# Patient Record
Sex: Female | Born: 1980 | Race: Black or African American | Hispanic: No | Marital: Married | Smoking: Never smoker
Health system: Southern US, Community
[De-identification: ages and names within clinical notes are randomized; demographics above are authoritative.]

## PROBLEM LIST (undated history)

## (undated) ENCOUNTER — Inpatient Hospital Stay (HOSPITAL_COMMUNITY): Payer: Self-pay

## (undated) DIAGNOSIS — O00109 Unspecified tubal pregnancy without intrauterine pregnancy: Secondary | ICD-10-CM

## (undated) DIAGNOSIS — E559 Vitamin D deficiency, unspecified: Secondary | ICD-10-CM

## (undated) DIAGNOSIS — D649 Anemia, unspecified: Secondary | ICD-10-CM

## (undated) DIAGNOSIS — N83209 Unspecified ovarian cyst, unspecified side: Secondary | ICD-10-CM

## (undated) DIAGNOSIS — Z8619 Personal history of other infectious and parasitic diseases: Secondary | ICD-10-CM

## (undated) DIAGNOSIS — N390 Urinary tract infection, site not specified: Secondary | ICD-10-CM

## (undated) DIAGNOSIS — I10 Essential (primary) hypertension: Secondary | ICD-10-CM

## (undated) HISTORY — PX: INDUCED ABORTION: SHX677

## (undated) HISTORY — DX: Anemia, unspecified: D64.9

## (undated) HISTORY — DX: Vitamin D deficiency, unspecified: E55.9

## (undated) HISTORY — PX: DILATION AND CURETTAGE OF UTERUS: SHX78

## (undated) HISTORY — DX: Personal history of other infectious and parasitic diseases: Z86.19

## (undated) HISTORY — DX: Essential (primary) hypertension: I10

---

## 2001-04-13 ENCOUNTER — Encounter: Payer: Self-pay | Admitting: Emergency Medicine

## 2001-04-13 ENCOUNTER — Emergency Department (HOSPITAL_COMMUNITY): Admission: EM | Admit: 2001-04-13 | Discharge: 2001-04-13 | Payer: Self-pay | Admitting: Emergency Medicine

## 2001-10-23 ENCOUNTER — Emergency Department (HOSPITAL_COMMUNITY): Admission: EM | Admit: 2001-10-23 | Discharge: 2001-10-23 | Payer: Self-pay

## 2002-09-25 ENCOUNTER — Emergency Department (HOSPITAL_COMMUNITY): Admission: EM | Admit: 2002-09-25 | Discharge: 2002-09-25 | Payer: Self-pay | Admitting: Emergency Medicine

## 2002-12-23 ENCOUNTER — Emergency Department (HOSPITAL_COMMUNITY): Admission: EM | Admit: 2002-12-23 | Discharge: 2002-12-24 | Payer: Self-pay | Admitting: Emergency Medicine

## 2003-12-31 ENCOUNTER — Ambulatory Visit: Payer: Self-pay | Admitting: Family Medicine

## 2004-02-03 ENCOUNTER — Ambulatory Visit: Payer: Self-pay | Admitting: Internal Medicine

## 2004-03-23 ENCOUNTER — Ambulatory Visit: Payer: Self-pay | Admitting: Family Medicine

## 2004-04-18 ENCOUNTER — Ambulatory Visit: Payer: Self-pay | Admitting: Family Medicine

## 2004-10-18 ENCOUNTER — Ambulatory Visit: Payer: Self-pay | Admitting: Family Medicine

## 2004-12-09 ENCOUNTER — Ambulatory Visit: Payer: Self-pay | Admitting: Family Medicine

## 2004-12-20 ENCOUNTER — Other Ambulatory Visit: Admission: RE | Admit: 2004-12-20 | Discharge: 2004-12-20 | Payer: Self-pay | Admitting: Obstetrics and Gynecology

## 2005-05-07 ENCOUNTER — Inpatient Hospital Stay (HOSPITAL_COMMUNITY): Admission: AD | Admit: 2005-05-07 | Discharge: 2005-05-07 | Payer: Self-pay | Admitting: Obstetrics and Gynecology

## 2005-06-19 ENCOUNTER — Inpatient Hospital Stay (HOSPITAL_COMMUNITY): Admission: AD | Admit: 2005-06-19 | Discharge: 2005-06-19 | Payer: Self-pay | Admitting: Obstetrics and Gynecology

## 2005-06-26 ENCOUNTER — Inpatient Hospital Stay (HOSPITAL_COMMUNITY): Admission: AD | Admit: 2005-06-26 | Discharge: 2005-06-26 | Payer: Self-pay | Admitting: Obstetrics and Gynecology

## 2005-07-25 ENCOUNTER — Inpatient Hospital Stay (HOSPITAL_COMMUNITY): Admission: AD | Admit: 2005-07-25 | Discharge: 2005-07-25 | Payer: Self-pay | Admitting: Obstetrics and Gynecology

## 2005-07-27 ENCOUNTER — Inpatient Hospital Stay (HOSPITAL_COMMUNITY): Admission: AD | Admit: 2005-07-27 | Discharge: 2005-07-30 | Payer: Self-pay | Admitting: Obstetrics and Gynecology

## 2005-07-31 ENCOUNTER — Encounter: Admission: RE | Admit: 2005-07-31 | Discharge: 2005-08-29 | Payer: Self-pay | Admitting: Obstetrics and Gynecology

## 2005-08-30 ENCOUNTER — Encounter: Admission: RE | Admit: 2005-08-30 | Discharge: 2005-09-12 | Payer: Self-pay | Admitting: Obstetrics and Gynecology

## 2005-09-11 ENCOUNTER — Other Ambulatory Visit: Admission: RE | Admit: 2005-09-11 | Discharge: 2005-09-11 | Payer: Self-pay | Admitting: Obstetrics and Gynecology

## 2006-09-14 ENCOUNTER — Other Ambulatory Visit: Admission: RE | Admit: 2006-09-14 | Discharge: 2006-09-14 | Payer: Self-pay | Admitting: Obstetrics and Gynecology

## 2006-11-20 ENCOUNTER — Emergency Department (HOSPITAL_COMMUNITY): Admission: EM | Admit: 2006-11-20 | Discharge: 2006-11-20 | Payer: Self-pay | Admitting: Emergency Medicine

## 2006-11-22 ENCOUNTER — Inpatient Hospital Stay (HOSPITAL_COMMUNITY): Admission: AD | Admit: 2006-11-22 | Discharge: 2006-11-22 | Payer: Self-pay | Admitting: Obstetrics & Gynecology

## 2010-07-01 NOTE — Discharge Summary (Signed)
NAMECORTNI, [name]                 ACCOUNT NO.:  [id number]   MEDICAL RECORD NO.:  [id number]          PATIENT TYPE:  INP   LOCATION:  9122                          FACILITY:  [facility]   PHYSICIAN:  James A. Ashley Royalty, M.D.DATE OF BIRTH:  May 23, 1980   DATE OF ADMISSION:  07/27/2005  DATE OF DISCHARGE:  07/30/2005                                 DISCHARGE SUMMARY   DISCHARGE DIAGNOSES:  1.  Intrauterine pregnancy at 39 weeks, delivered.  2.  Term birth living child, vertex.  3.  Chorioamnionitis.   OPERATIONS AND PROCEDURES:  OB delivery.   CONSULTATIONS:  None.   DISCHARGE MEDICATIONS:  1.  Motrin 800 mg.  2.  Ferrous sulfate.  3.  Prenatal vitamins.   HISTORY OF PRESENT ILLNESS:  This is a 30 year old gravida 2, para 0, AB 1,  [redacted] weeks gestation.  Prenatal care was essentially uncomplicated.  The  patient presented to the office complaining of contractions.  She was  subsequently referred to return to the Admissions Unit where cervical change  was documented.  Initial dilatation was 2 cm, 80% effacement, 0 station,  Vertex presentation.  For the remainder of this physical please see chart.   HOSPITAL COURSE:  The patient was admitted to [facility].  Admission laboratory studies were drawn.  Artificial rupture of  membranes was accomplished and intrauterine pressure catheter placed.  Pitocin was administered.  The patient developed a fever prior to delivery  consistent with chorioamnionitis.  She went on to delivery on July 28, 2005.  The infant was a 6 pound 5 ounce female, Apgars 8 at 1 minute and 9 at 5  minutes, sent to the newborn nursery.  Delivery was accomplished by Dr.  Sydnee Cabal over an intact peroneum.  There was first degree laceration which  was repair without difficulty.  Postpartum, the patient was maintained on IV  antibiotics and defervesced rapidly.  By July 30, 2005, she was felt to be a  candidate for discharge and was discharged home,  afebrile and in  satisfactory condition.   DISPOSITION:  The patient is to return to [facility] in  4-6 weeks for postpartum evaluation.      James A. Ashley Royalty, M.D.  Electronically Signed     JAM/MEDQ  D:  09/07/2005  T:  09/07/2005  Job:  [id number]

## 2010-11-24 LAB — URINALYSIS, ROUTINE W REFLEX MICROSCOPIC
Glucose, UA: NEGATIVE
Hgb urine dipstick: NEGATIVE
Ketones, ur: NEGATIVE
Nitrite: NEGATIVE
Protein, ur: NEGATIVE
Specific Gravity, Urine: 1.025
Urobilinogen, UA: 1
pH: 7

## 2010-11-24 LAB — POCT PREGNANCY, URINE
Operator id: 235561
Preg Test, Ur: POSITIVE

## 2010-11-24 LAB — URINE MICROSCOPIC-ADD ON

## 2011-03-27 ENCOUNTER — Other Ambulatory Visit: Payer: Self-pay | Admitting: Obstetrics and Gynecology

## 2011-03-27 ENCOUNTER — Inpatient Hospital Stay (HOSPITAL_COMMUNITY)
Admission: AD | Admit: 2011-03-27 | Discharge: 2011-03-27 | Disposition: A | Discharge status: Home / Self Care | Payer: 59 | Source: Ambulatory Visit | Attending: Obstetrics & Gynecology | Admitting: Obstetrics & Gynecology

## 2011-03-27 ENCOUNTER — Encounter (HOSPITAL_COMMUNITY): Payer: Self-pay | Admitting: *Deleted

## 2011-03-27 DIAGNOSIS — O00109 Unspecified tubal pregnancy without intrauterine pregnancy: Secondary | ICD-10-CM | POA: Insufficient documentation

## 2011-03-27 DIAGNOSIS — O009 Unspecified ectopic pregnancy without intrauterine pregnancy: Secondary | ICD-10-CM | POA: Diagnosis present

## 2011-03-27 HISTORY — DX: Unspecified ovarian cyst, unspecified side: N83.209

## 2011-03-27 HISTORY — DX: Urinary tract infection, site not specified: N39.0

## 2011-03-27 LAB — HCG, QUANTITATIVE, PREGNANCY: hCG, Beta Chain, Quant, S: 6394 m[IU]/mL — ABNORMAL HIGH (ref <5)

## 2011-03-27 LAB — TYPE AND SCREEN: ABO/RH(D): A POS

## 2011-03-27 LAB — CBC
HCT: 34.8 % — ABNORMAL LOW (ref 36.0–46.0)
Hemoglobin: 11.1 g/dL — ABNORMAL LOW (ref 12.0–15.0)
MCH: 22.8 pg — ABNORMAL LOW (ref 26.0–34.0)
MCHC: 31.9 g/dL (ref 30.0–36.0)
MCV: 71.6 fL — ABNORMAL LOW (ref 78.0–100.0)
Platelets: 270 10*3/uL (ref 150–400)
RBC: 4.86 MIL/uL (ref 3.87–5.11)
RDW: 14.7 % (ref 11.5–15.5)
WBC: 7.5 10*3/uL (ref 4.0–10.5)

## 2011-03-27 LAB — BUN: BUN: 12 mg/dL (ref 6–23)

## 2011-03-27 LAB — AST: AST: 15 U/L (ref 0–37)

## 2011-03-27 LAB — CREATININE, SERUM
Creatinine, Ser: 0.63 mg/dL (ref 0.50–1.10)
GFR calc Af Amer: >90 mL/min (ref >90)
GFR calc non Af Amer: >90 mL/min (ref >90)

## 2011-03-27 LAB — DIFFERENTIAL
Basophils Absolute: 0 10*3/uL (ref 0.0–0.1)
Basophils Relative: 0 % (ref 0–1)
Eosinophils Absolute: 0 10*3/uL (ref 0.0–0.7)
Eosinophils Relative: 0 % (ref 0–5)
Lymphocytes Relative: 20 % (ref 12–46)
Lymphs Abs: 1.5 10*3/uL (ref 0.7–4.0)
Monocytes Absolute: 0.7 10*3/uL (ref 0.1–1.0)
Monocytes Relative: 9 % (ref 3–12)
Neutro Abs: 5.3 10*3/uL (ref 1.7–7.7)
Neutrophils Relative %: 71 % (ref 43–77)

## 2011-03-27 LAB — ABO/RH: ABO/RH(D): A POS

## 2011-03-27 MED ORDER — METHOTREXATE INJECTION FOR WOMEN'S HOSPITAL
50.0000 mg/m2 | Freq: Once | INTRAMUSCULAR | Status: AC
  Administered 2011-03-27: 90 mg via INTRAMUSCULAR
  Filled 2011-03-27: qty 1.8

## 2011-03-27 NOTE — ED Notes (Signed)
Tolerated injection.

## 2011-03-30 ENCOUNTER — Inpatient Hospital Stay (HOSPITAL_COMMUNITY)
Admission: AD | Admit: 2011-03-30 | Discharge: 2011-03-30 | Disposition: A | Discharge status: Home / Self Care | Payer: 59 | Source: Ambulatory Visit | Attending: Obstetrics | Admitting: Obstetrics

## 2011-03-30 ENCOUNTER — Encounter (HOSPITAL_COMMUNITY): Payer: Self-pay | Admitting: *Deleted

## 2011-03-30 DIAGNOSIS — O00109 Unspecified tubal pregnancy without intrauterine pregnancy: Secondary | ICD-10-CM | POA: Insufficient documentation

## 2011-03-30 NOTE — H&P (Signed)
CC: ectopic f/u  HPI: 31 yo G4P1021 (SVD x 1, TOP x 2) w/ planned, desired preg who had eval in office 2/11 for bleeding. Had u/s, beta 6k and empty uterus, per pt "spot in tube" and pt stable and given MTX here on 2/11. Her for f/u beta. Pt notes some mild dizziness after standing from laying down. Abd pain much improved from last wk. No vaginal bleeding, no cramping but some pain in R leg. Some nausea, no emesis and tol reg po. Voiding and BM normal. No prior h/o ectopic preg, no h/o abd surgeries, no h/o GC/CT. Has stopped PNV.  PMH: none PSH: none Meds: MTX All: sulfa allergy  PE:  Filed Vitals:   03/30/11 0804  BP: 119/59  Pulse: 77  Temp: 99 F (37.2 C)  TempSrc: Oral  Resp: 16   CV: RRR Pulm: CTAB Abd: soft, NT, ND, no RUQ pain, no cvat GU: def LE: NT, no edema  A/P: ectopic preg, medical management w/ MTX at this time, day 4 today. Slight rise in b-hcg, appropriate given MTX administration, will decide on effectiveness of MTX based on day 7. Recc lab only visit here at [facility] on Sun- day 7. Will assess for 15% decline from today. Pt aware options of surgery- salpingectomy vs salpingostomy for treatment of ectopic and had chosen medical management. Aware of risks of tubal rupture and warning signs reviewed, pt aware will need immmediate eval. Pt aware potential effefcts on fertility and recc for HSG before attempted conception. Aware to stop PNV at this time and need to follow labs to <2. Clinically stable, no evidence tubal rupture. D/c home.  Beronica Lansdale A. 03/30/2011 10:32 AM

## 2011-03-30 NOTE — Progress Notes (Signed)
Patient to MAU for day 4 BHCG s/p MTX. Has started having a little right lower back and right side pain last night. Denies any bleeding. Instructed patient not to eat or drink while waiting.

## 2011-03-30 NOTE — Discharge Instructions (Signed)
Ectopic Pregnancy An ectopic pregnancy happens when a fertilized egg grows outside the uterus. A pregnancy cannot survive outside of the uterus. You may or may not need surgery to remove it. HOME CARE If you were given medicine only:  Do not drink alcohol.   Do not take vitamins with folic acid.   Only take medicine as told by your doctor.  If you had surgery:  Do not have sex (intercourse) until your doctor says it is okay.   Do not lift anything over 5 pounds (2.3 kilograms).   Only take medicine as told by your doctor. Do not take aspirin.   Avoid alcohol if you are taking pain medicine.   Change your bandages (dressings) as told by your doctor.   Keep all doctor visits as told.   Get plenty of rest.  GET HELP RIGHT AWAY IF:  You were given medicine and:  You feel sick to your stomach (nauseous).   You throw up (vomit).   You have watery poop (diarrhea).   You feel dizzy.  You had surgery and:  You have bleeding from the surgical cut (incision).   You have redness, puffiness (swelling), and pain around the surgical cut.   You have yellowish-white fluid (pus) coming out of the surgical cut.   Your surgical cut opens up.   You are short of breath.   You have chest or leg pain.   You are dizzy or pass out (faint).   You have a fever.  MAKE SURE YOU:   Understand these instructions.   Will watch your condition.   Will get help right away if you are not doing well or get worse.  Document Released: 04/28/2008 Document Revised: 10/12/2010 Document Reviewed: 12/18/2008 [facility] Patient Information 2012 [place], [place].Ectopic Pregnancy Ectopic pregnancy means the fertilized egg attached (implanted) outside the uterus. This occurs in about 1 out of 50 pregnancies. Many ectopic pregnancies occur in the fallopian tube (tube between the uterus and ovary). Ectopic pregnancies rarely occur on the ovary, intestine, pelvis, or cervix. It is the biggest cause of women  dying in the first 12 weeks of their pregnancy. Death is a result of fallopian tube tearing (rupture) with severe internal bleeding. A tubal pregnancy does not have the room or blood supply to develop normally. As it grows, it stretches the walls of the tube. This causes pain. If an ectopic pregnancy ruptures through the tube, serious internal bleeding can occur, along with severe pain, fainting, and shock. This is a surgical emergency. CAUSES   Previous ectopic pregnancy.   Pelvic infection (PID, pelvic inflammatory disease).   Previous pelvic or abdominal surgery.   Uterus lining tissue growing outside the uterus (endometriosis).   Using an intrauterine device (IUD) for birth control (rare).   DES (estrogen drug) exposure when your mother was pregnant with you.   Pregnancy in women over 89 years old.   Smoking.   Infertility treatment, stimulating the ovaries to produce eggs for in vitro fertilization.   Previous fallopian tube surgery, such as reversing a tubal ligation (female sterilization, "tied tubes").   Blocked tubes.  Many times, there is no apparent cause. SYMPTOMS   The usual pregnancy symptoms:   Nausea.   Tiredness.   Breast tenderness.   Pain with intercourse.   Irregular vaginal bleeding or spotting.   Cramping or pain on one side, or in the lower abdomen.   Fast heartbeat.   Passing out while having a bowel movement.   If the  tube ruptures and you develop internal bleeding, there will likely be:   Sudden, severe pain in the abdomen and pelvis.   Dizziness or fainting.   Pain in the shoulder area (sometimes).  DIAGNOSIS   Diagnosis is made first by confirming the pregnancy with a pregnancy test.   Ultrasound.   Testing levels of pregnancy hormones and changing of the hormone levels in the blood.   Taking a sample of uterus tissue (D&C, dilation and curettage).   Visual exam inside the abdomen, using a lighted tube (laparoscopy).    Rarely, CT scan and MRI are needed.  TREATMENT   An injection of methotrexate medicine. This is given if:   The diagnosis is made early, and the pregnancy in the tube is 3.5 centimeters or less in size.   The tube has not ruptured.   Usually, pregnancy hormone blood levels are followed after this treatment, to be sure there is no pregnancy tissue left.   It takes 4 to 6 weeks for the pregnancy to be absorbed.   Surgical treatment, using a lighted tube (laparoscope), to remove the tubal pregnancy.   If severe internal bleeding occurs, a cut (incision) in the lower abdomen (laparotomy) is made, to remove the tubal pregnancy and to stop bleeding. This is a surgical emergency.   The area of the tube with the pregnancy may be removed. Sometimes, the whole tube must be removed.   After surgery, pregnancy hormone tests may be done to be sure there is no pregnancy tissue left.   If the mother has Rh-negative blood and the father is Rh-positive, you will need a RhoGAM shot to prevent Rh problems with future pregnancies.   You may need a blood transfusion (donated blood).   You may be put on an antibiotic medicine.  HOME CARE INSTRUCTIONS   Get plenty of rest.   Avoid sexual intercourse until your caregiver says it is OK.   Avoid lifting more than 5 pounds.   Eat a well balanced diet.   Do not drink alcoholic drinks, especially while taking pain medicine.   Take all the medicines your caregiver gives you, including iron pills if you lost a lot of blood.   Do not take aspirin, because it can cause bleeding.   If you had abdominal surgery, change your dressing as instructed.   Do not lift, over-exercise, drive, or do household chores until your caregiver gives you permission.   If you develop constipation, you may take a mild laxative recommended by your caregiver. Bran foods and drinking a lot of liquids helps with constipation.   Try to have someone help you with household  chores for a week or two.   Make and keep all your follow up appointments and blood tests.   If you had methotrexate treatment:   Avoid all forms of alcohol.   Avoid vitamins with folic acid in them.   Do not take nonsteroidal anti-inflammatory drugs (NSAIDs), such as ibuprofen.   You and your partner may develop emotional problems. If so:   Grieving helps.   Counseling may be necessary.   Let your body and mind heal before trying to get pregnant again.  SEEK IMMEDIATE MEDICAL CARE IF:   You develop severe pain in the pelvis, abdomen, back, side, or shoulder, especially if you are pregnant.   You develop extreme weakness, fainting, repeated vomiting, or dehydration.   You develop heavy vaginal bleeding or passage of tissue.   You have an oral temperature above  102 F (38.9 C), not controlled by medicine.   You develop a rash or have a reaction to your medicine.   You pass out while having a bowel movement. This indicates a rupture of the tube, with internal bleeding.   You had abdominal surgery and:   There is bleeding from the incision.   There is redness, swelling, and pain around the incision.   There is pus coming out of the incision.   The incision is opening up.   You have shortness of breath.   You have chest or leg pain.   You become dizzy or pass out.   You have side effects from the methotrexate injection, including:   Nausea.   Vomiting.   Diarrhea.   Dizziness.  Document Released: 03/09/2004 Document Revised: 08/15/2010 Document Reviewed: 02/09/2009 [facility] Patient Information 2012 [place], [place].

## 2011-04-01 ENCOUNTER — Other Ambulatory Visit: Payer: Self-pay | Admitting: Obstetrics and Gynecology

## 2011-04-01 ENCOUNTER — Encounter (HOSPITAL_COMMUNITY): Payer: Self-pay

## 2011-04-01 ENCOUNTER — Inpatient Hospital Stay (HOSPITAL_COMMUNITY): Payer: 59

## 2011-04-01 ENCOUNTER — Ambulatory Visit (HOSPITAL_COMMUNITY)
Admission: AD | Admit: 2011-04-01 | Discharge: 2011-04-01 | Disposition: A | Discharge status: Home / Self Care | Payer: 59 | Source: Ambulatory Visit | Attending: Obstetrics and Gynecology | Admitting: Obstetrics and Gynecology

## 2011-04-01 ENCOUNTER — Encounter (HOSPITAL_COMMUNITY)
Admission: AD | Disposition: A | Discharge status: Home / Self Care | Payer: Self-pay | Source: Ambulatory Visit | Attending: Obstetrics and Gynecology

## 2011-04-01 DIAGNOSIS — O009 Unspecified ectopic pregnancy without intrauterine pregnancy: Principal | ICD-10-CM | POA: Insufficient documentation

## 2011-04-01 DIAGNOSIS — O00109 Unspecified tubal pregnancy without intrauterine pregnancy: Secondary | ICD-10-CM | POA: Diagnosis present

## 2011-04-01 HISTORY — PX: LAPAROSCOPY: SHX197

## 2011-04-01 HISTORY — DX: Unspecified tubal pregnancy without intrauterine pregnancy: O00.109

## 2011-04-01 LAB — HCG, QUANTITATIVE, PREGNANCY
hCG, Beta Chain, Quant, S: 7849 m[IU]/mL — ABNORMAL HIGH (ref <5)
hCG, Beta Chain, Quant, S: 6567 m[IU]/mL — ABNORMAL HIGH (ref <5)

## 2011-04-01 LAB — CBC
MCH: 22.5 pg — ABNORMAL LOW (ref 26.0–34.0)
MCHC: 31.9 g/dL (ref 30.0–36.0)
Platelets: 242 10*3/uL (ref 150–400)
RBC: 4.53 MIL/uL (ref 3.87–5.11)

## 2011-04-01 SURGERY — LAPAROSCOPY OPERATIVE
Anesthesia: General | Site: Abdomen | Laterality: Right | Wound class: Clean Contaminated

## 2011-04-01 MED ORDER — LIDOCAINE HCL (CARDIAC) 20 MG/ML IV SOLN
INTRAVENOUS | Status: DC | PRN
  Administered 2011-04-01: 40 mg via INTRAVENOUS

## 2011-04-01 MED ORDER — DEXAMETHASONE SODIUM PHOSPHATE 4 MG/ML IJ SOLN
INTRAMUSCULAR | Status: DC | PRN
  Administered 2011-04-01: 8 mg via INTRAVENOUS

## 2011-04-01 MED ORDER — FENTANYL CITRATE 0.05 MG/ML IJ SOLN
INTRAMUSCULAR | Status: AC
  Administered 2011-04-01: 50 ug via INTRAVENOUS
  Filled 2011-04-01: qty 2

## 2011-04-01 MED ORDER — KETOROLAC TROMETHAMINE 30 MG/ML IJ SOLN: 15.0000 mg | Freq: Once | INTRAMUSCULAR | Status: DC | PRN

## 2011-04-01 MED ORDER — FENTANYL CITRATE 0.05 MG/ML IJ SOLN
25.0000 ug | INTRAMUSCULAR | Status: DC | PRN
  Administered 2011-04-01 (×2): 50 ug via INTRAVENOUS

## 2011-04-01 MED ORDER — NEOSTIGMINE METHYLSULFATE 1 MG/ML IJ SOLN
INTRAMUSCULAR | Status: DC | PRN
  Administered 2011-04-01: 3 mg via INTRAVENOUS

## 2011-04-01 MED ORDER — HYDROCODONE-ACETAMINOPHEN 5-500 MG PO TABS: 1.0000 | ORAL_TABLET | Freq: Four times a day (QID) | ORAL | Status: AC | PRN

## 2011-04-01 MED ORDER — IBUPROFEN 800 MG PO TABS: 800.0000 mg | ORAL_TABLET | Freq: Three times a day (TID) | ORAL | Status: AC | PRN

## 2011-04-01 MED ORDER — LACTATED RINGERS IR SOLN
Status: DC | PRN
  Administered 2011-04-01: 3000 mL

## 2011-04-01 MED ORDER — LACTATED RINGERS IV SOLN
INTRAVENOUS | Status: DC | PRN
  Administered 2011-04-01: 12:00:00 via INTRAVENOUS

## 2011-04-01 MED ORDER — PROMETHAZINE HCL 25 MG/ML IJ SOLN: 6.2500 mg | INTRAMUSCULAR | Status: DC | PRN

## 2011-04-01 MED ORDER — FENTANYL CITRATE 0.05 MG/ML IJ SOLN
INTRAMUSCULAR | Status: DC | PRN
  Administered 2011-04-01 (×2): 100 ug via INTRAVENOUS
  Administered 2011-04-01: 50 ug via INTRAVENOUS

## 2011-04-01 MED ORDER — ACETAMINOPHEN 325 MG PO TABS: 325.0000 mg | ORAL_TABLET | ORAL | Status: DC | PRN

## 2011-04-01 MED ORDER — MIDAZOLAM HCL 5 MG/5ML IJ SOLN
INTRAMUSCULAR | Status: DC | PRN
  Administered 2011-04-01: 2 mg via INTRAVENOUS

## 2011-04-01 MED ORDER — BUPIVACAINE HCL (PF) 0.25 % IJ SOLN
INTRAMUSCULAR | Status: DC | PRN
  Administered 2011-04-01: 8 mL

## 2011-04-01 MED ORDER — CITRIC ACID-SODIUM CITRATE 334-500 MG/5ML PO SOLN
30.0000 mL | Freq: Once | ORAL | Status: AC
  Administered 2011-04-01: 30 mL via ORAL
  Filled 2011-04-01: qty 15

## 2011-04-01 MED ORDER — FAMOTIDINE IN NACL 20-0.9 MG/50ML-% IV SOLN
20.0000 mg | Freq: Once | INTRAVENOUS | Status: AC
  Administered 2011-04-01: 20 mg via INTRAVENOUS
  Filled 2011-04-01: qty 50

## 2011-04-01 MED ORDER — SUCCINYLCHOLINE CHLORIDE 20 MG/ML IJ SOLN
INTRAMUSCULAR | Status: DC | PRN
  Administered 2011-04-01: 120 mg via INTRAVENOUS

## 2011-04-01 MED ORDER — GLYCOPYRROLATE 0.2 MG/ML IJ SOLN
INTRAMUSCULAR | Status: DC | PRN
  Administered 2011-04-01: .4 mg via INTRAVENOUS

## 2011-04-01 MED ORDER — ROCURONIUM BROMIDE 100 MG/10ML IV SOLN
INTRAVENOUS | Status: DC | PRN
  Administered 2011-04-01: 30 mg via INTRAVENOUS
  Administered 2011-04-01: 5 mg via INTRAVENOUS

## 2011-04-01 MED ORDER — KETOROLAC TROMETHAMINE 30 MG/ML IJ SOLN
INTRAMUSCULAR | Status: DC | PRN
  Administered 2011-04-01: 30 mg via INTRAMUSCULAR
  Administered 2011-04-01: 30 mg via INTRAVENOUS

## 2011-04-01 MED ORDER — VASOPRESSIN 20 UNIT/ML IJ SOLN
INTRAMUSCULAR | Status: DC | PRN
  Administered 2011-04-01: 20 [IU]

## 2011-04-01 MED ORDER — PROPOFOL 10 MG/ML IV EMUL
INTRAVENOUS | Status: DC | PRN
  Administered 2011-04-01: 150 mg via INTRAVENOUS

## 2011-04-01 MED ORDER — SODIUM CHLORIDE 0.9 % IJ SOLN
INTRAMUSCULAR | Status: DC | PRN
  Administered 2011-04-01: 10 mL

## 2011-04-01 MED ORDER — ONDANSETRON HCL 4 MG/2ML IJ SOLN
INTRAMUSCULAR | Status: DC | PRN
  Administered 2011-04-01: 4 mg via INTRAVENOUS

## 2011-04-01 SURGICAL SUPPLY — 39 items
ADH SKN CLS APL DERMABOND .7 (GAUZE/BANDAGES/DRESSINGS) ×2
APPLICATOR COTTON TIP 6IN STRL (MISCELLANEOUS) ×2 IMPLANT
BAG SPEC RTRVL LRG 6X4 10 (ENDOMECHANICALS)
CABLE HIGH FREQUENCY MONO STRZ (ELECTRODE) IMPLANT
CATH ROBINSON RED A/P 16FR (CATHETERS) ×1 IMPLANT
CLOTH BEACON ORANGE TIMEOUT ST (SAFETY) ×2 IMPLANT
DERMABOND ADVANCED (GAUZE/BANDAGES/DRESSINGS) ×2
DERMABOND ADVANCED .7 DNX12 (GAUZE/BANDAGES/DRESSINGS) ×1 IMPLANT
ELECT LIGASURE LONG (ELECTRODE) IMPLANT
ELECT REM PT RETURN 9FT ADLT (ELECTROSURGICAL) ×2
ELECTRODE REM PT RTRN 9FT ADLT (ELECTROSURGICAL) IMPLANT
EVACUATOR SMOKE 8.L (FILTER) ×2 IMPLANT
FORCEPS CUTTING 33CM 5MM (CUTTING FORCEPS) IMPLANT
FORCEPS CUTTING 45CM 5MM (CUTTING FORCEPS) IMPLANT
GLOVE BIO SURGEON STRL SZ 6.5 (GLOVE) ×2 IMPLANT
GLOVE BIOGEL PI IND STRL 7.0 (GLOVE) ×2 IMPLANT
GLOVE BIOGEL PI INDICATOR 7.0 (GLOVE) ×2
GOWN PREVENTION PLUS LG XLONG (DISPOSABLE) ×6 IMPLANT
NDL INSUFFLATION 14GA 120MM (NEEDLE) ×1 IMPLANT
NDL SPNL 22GX7 QUINCKE BK (NEEDLE) IMPLANT
NEEDLE GYRUS 33CM (NEEDLE) ×1 IMPLANT
NEEDLE INSUFFLATION 14GA 120MM (NEEDLE) ×2 IMPLANT
NEEDLE SPNL 22GX7 QUINCKE BK (NEEDLE) ×2 IMPLANT
NS IRRIG 1000ML POUR BTL (IV SOLUTION) ×2 IMPLANT
PACK LAPAROSCOPY BASIN (CUSTOM PROCEDURE TRAY) ×2 IMPLANT
POUCH SPECIMEN RETRIEVAL 10MM (ENDOMECHANICALS) IMPLANT
PROTECTOR NERVE ULNAR (MISCELLANEOUS) ×2 IMPLANT
SCISSORS LAP 5X35 DISP (ENDOMECHANICALS) ×1 IMPLANT
SET IRRIG TUBING LAPAROSCOPIC (IRRIGATION / IRRIGATOR) ×1 IMPLANT
SOLUTION ELECTROLUBE (MISCELLANEOUS) IMPLANT
SUT VICRYL 0 UR6 27IN ABS (SUTURE) ×2 IMPLANT
SUT VICRYL 4-0 PS2 18IN ABS (SUTURE) ×2 IMPLANT
TOWEL OR 17X24 6PK STRL BLUE (TOWEL DISPOSABLE) ×4 IMPLANT
TRAY FOLEY CATH 14FR (SET/KITS/TRAYS/PACK) ×1 IMPLANT
TROCAR BALLN 12MMX100 BLUNT (TROCAR) IMPLANT
TROCAR Z-THREAD BLADED 11X100M (TROCAR) ×2 IMPLANT
TROCAR Z-THREAD BLADED 5X100MM (TROCAR) ×4 IMPLANT
WARMER LAPAROSCOPE (MISCELLANEOUS) ×2 IMPLANT
WATER STERILE IRR 1000ML POUR (IV SOLUTION) ×2 IMPLANT

## 2011-04-01 NOTE — Progress Notes (Signed)
Pt had MTX injection on MOnday. Started having pain last night on right side and back . Pain much worse now and shoot down right leg.

## 2011-04-01 NOTE — ED Provider Notes (Signed)
CC: ectopic f/u / increased pain today  HPI: 31 yo G4P1021 (SVD x 1, TOP x 2) w/ planned, desired preg who had eval in office 2/11 for bleeding. Had u/s, beta 6k and empty uterus,  R adnexa mass c/w ectopic. Patient opted for medical management, given MTX here on 2/11. Reports pain 9/10 early am, now decreased 5/10, declines pain meds. Denies VB / N / V / dizziness / SOB Quant day 4 w/ slight rise, for repeat tomorrow.  History     Chief Complaint  Patient presents with  . Abdominal Pain  . Ectopic Pregnancy     OB History    Grav Para Term Preterm Abortions TAB SAB Ect Mult Living   4 1 1  0 2 2 0 0 0 1      Past Medical History  Diagnosis Date  . Ectopic pregnancy Feb 2013  . Urinary tract infection   . Ovarian cyst   . Ectopic pregnancy, tubal 04/01/2011    Past Surgical History  Procedure Date  . Induced abortion     x 2    Family History  Problem Relation Age of Onset  . Anesthesia problems Neg Hx   . Asthma Neg Hx   . Diabetes Neg Hx   . Heart disease Neg Hx   . Hypertension Neg Hx     History  Substance Use Topics  . Smoking status: Never Smoker   . Smokeless tobacco: Never Used  . Alcohol Use: No    Allergies:  Allergies  Allergen Reactions  . Sulfa Antibiotics Rash    Prescriptions prior to admission  Medication Sig Dispense Refill  . Prenatal Vit-Fe Fumarate-FA (PRENATAL MULTIVITAMIN) TABS Take 1 tablet by mouth daily.        ROS: negative except as noted  Physical Exam   Blood pressure 106/60, pulse 90, temperature 100.2 F (37.9 C), temperature source Oral, resp. rate 18, height 5' 4.5" (1.638 m), last menstrual period 02/06/2011.  Gen: AAOx3, NAD, appears sleepy, laying in bed in fetal position CV: RRR Pulm: CTAB ABD: gentle palp, no guarding or rigidity Pelvic: deferred Ext: no edema  ED Course   A/P: ectopic pregnancy s/s MTX 6 days ago with worsening pain clinically not indicative of rupture but cannot r/o live  ectopic  Plan CBC, rpt quant, pelvic sono,  Consult Dr. Billy Coast - agrees Will update MD w/ findings  D.Paul,CNM 04/01/2011 10:20 AM  Addendum:  Live ectopic with (+) FHT's observed on sono, quant increased today, stable H&H Not c/w rupture Dr. Cherly Hensen notified, will prep for surgical management - laparoscopic  D. Renae Fickle, CNM 04/01/2011 11:27 AM

## 2011-04-01 NOTE — Anesthesia Preprocedure Evaluation (Signed)
Anesthesia Evaluation  Patient identified by MRN, date of birth, ID band Patient awake    Reviewed: Allergy & Precautions, H&P , Patient's Chart, lab work & pertinent test results, reviewed documented beta blocker date and time   History of Anesthesia Complications Negative for: history of anesthetic complications  Airway Mallampati: II TM Distance: >3 FB Neck ROM: full    Dental No notable dental hx.    Pulmonary neg pulmonary ROS,  clear to auscultation  Pulmonary exam normal       Cardiovascular Exercise Tolerance: Good neg cardio ROS regular Normal    Neuro/Psych Negative Neurological ROS  Negative Psych ROS   GI/Hepatic negative GI ROS, Neg liver ROS,   Endo/Other  Negative Endocrine ROS  Renal/GU negative Renal ROS     Musculoskeletal   Abdominal   Peds  Hematology negative hematology ROS (+)   Anesthesia Other Findings   Reproductive/Obstetrics negative OB ROS                           Anesthesia Physical Anesthesia Plan  ASA: II and Emergent  Anesthesia Plan: General ETT   Post-op Pain Management:    Induction: Rapid sequence, Cricoid pressure planned and Intravenous  Airway Management Planned: Oral ETT  Additional Equipment:   Intra-op Plan:   Post-operative Plan:   Informed Consent: I have reviewed the patients History and Physical, chart, labs and discussed the procedure including the risks, benefits and alternatives for the proposed anesthesia with the patient or authorized representative who has indicated his/her understanding and acceptance.   Dental Advisory Given  Plan Discussed with: CRNA and Surgeon  Anesthesia Plan Comments:         Anesthesia Quick Evaluation

## 2011-04-01 NOTE — Brief Op Note (Signed)
04/01/2011  1:44 PM  PATIENT:  Quay Burow  31 y.o. female  PRE-OPERATIVE DIAGNOSIS:  Right Ectopic Pregnancy  POST-OPERATIVE DIAGNOSIS: Right distal Ectopic Pregnancy with hemoperitoneum  PROCEDURE:  Procedure(s) (LRB): LAPAROSCOPY OPERATIVE (Right), right salpingostomy  SURGEON:  Surgeon(s) and Role:    * Serita Kyle, MD - Primary  PHYSICIAN ASSISTANT:   ASSISTANTS: Arlan Organ, CNM   ANESTHESIA:   general  FINDINGS:  NL APPENDIX, NL LEFT TUBE AND OVARY, RIGHT DISTAL ECTOPIC, 50 CC HEMOPERITONEUM, NL LIVER EDGE, NL UTERUS  EBL:  Total I/O In: 1000 [I.V.:1000] Out: 300 [Urine:200; Blood:100]  BLOOD ADMINISTERED:none  DRAINS: none   LOCAL MEDICATIONS USED:  MARCAINE     SPECIMEN:  Source of Specimen:  right tube POC  DISPOSITION OF SPECIMEN:  PATHOLOGY  COUNTS:  YES  TOURNIQUET:  * No tourniquets in log *  DICTATION: .Other Dictation: Dictation Number [id number]  PLAN OF CARE: Discharge to home after PACU  PATIENT DISPOSITION:  PACU - hemodynamically stable.   Delay start of Pharmacological VTE agent (>24hrs) due to surgical blood loss or risk of bleeding: no

## 2011-04-01 NOTE — Progress Notes (Signed)
Pt known to me. Hx reviewed  Presumed right ectopic preg resulted in MTX 2/11. Pt now presents with right sided pelvic pain, increased hquant and live preg in right adnexa.  IMP: right ectopic preg P) dx laparoscopy, possible right salpingectomy  Procedure reviewed. Risk of surgery including infection, bleeding, poss loss of involved tube, injury to surrounding organ structures

## 2011-04-01 NOTE — Transfer of Care (Signed)
Immediate Anesthesia Transfer of Care Note  Patient: [name]  Procedure(s) Performed: Procedure(s) (LRB): LAPAROSCOPY OPERATIVE (Right)  Patient Location: PACU  Anesthesia Type: General  Level of Consciousness: awake, alert , oriented and patient cooperative  Airway & Oxygen Therapy: Patient Spontanous Breathing and Patient connected to nasal cannula oxygen  Post-op Assessment: Report given to PACU RN  Post vital signs: Reviewed  Complications: No apparent anesthesia complications

## 2011-04-02 NOTE — Anesthesia Postprocedure Evaluation (Signed)
Anesthesia Post Note  Patient: [name]  Procedure(s) Performed: Procedure(s) (LRB): LAPAROSCOPY OPERATIVE (Right)  Anesthesia type: GA  Patient location: PACU  Post pain: Pain level controlled  Post assessment: Post-op Vital signs reviewed  Last Vitals:  Filed Vitals:   04/01/11 1515  BP: 121/59  Pulse: 96  Temp: 37 C  Resp: 20    Post vital signs: Reviewed  Level of consciousness: sedated  Complications: No apparent anesthesia complications

## 2011-04-02 NOTE — Op Note (Signed)
NAMEHANSINI, [name]                 ACCOUNT NO.:  [id number]  MEDICAL RECORD NO.:  [id number]  LOCATION:  [facility]                          FACILITY:  [facility]  PHYSICIAN:  Maxie Better, M.D.DATE OF BIRTH:  [birth year]  DATE OF PROCEDURE:  04/01/2011 DATE OF DISCHARGE:  04/01/2011                              OPERATIVE REPORT   PREOPERATIVE DIAGNOSIS:  Right ectopic pregnancy.  POSTOPERATIVE DIAGNOSIS:  Right distal ectopic pregnancy with hemoperitoneum.  PROCEDURE:  Operative laparoscopy, right salpingostomy.  ANESTHESIA:  General.  SURGEON:  Maxie Better, MD  ASSISTANT:  Arlan Organ, CNM  INDICATIONS:  A 31 year old, gravida 4, para 1-0-2-1, married black female, with a presumed right ectopic pregnancy diagnosed at the beginning of the week, who underwent methotrexate therapy on Monday, but who presented with right-sided abdominal pain, and was found on ultrasound today with fetal heart rate a live ectopic in the right adnexal area.  The patient's quant was over 7800.  The patient was apprised of the recommendation for surgical management.  Consent was signed.  The patient was transferred to the operating room.  Her blood type is AB positive.  PROCEDURE:  Under adequate general anesthesia, the patient was placed in a dorsal lithotomy position.  She was sterilely prepped and draped in the usual fashion.  The indwelling Foley catheter was placed. Examination under anesthesia for the presentation of the uterus was notable for anteverted uterus.  A bivalve speculum was placed in the vagina.  Single-tooth tenaculum was placed on the anterior lip of the cervix and acorn cannula was introduced into the cervical os and attached to the tenaculum for manipulation of the uterus.  Attention was then turned to the abdomen which was sterilely prepped and draped. Marcaine 0.25% was injected infraumbilically.  An infraumbilical incision was then made.  Veress needle was introduced and  tested. Opening pressure of 2 was noted.  A 3 L of CO2 was insufflated.  The Veress needle was then removed.  A 10-mm disposable trocar with sleeve was introduced into the abdomen without incident.  A lighted video laparoscope was placed through that port.  On immediate inspection, there was no evidence of trauma from the procedure itself.  There was evidence of hemoperitoneum in the posterior and anterior cul-de-sac as well as in the right paracolic gutter.  Small incisions  were placed in the right and left lower quadrant and on direct visualization, 5 mm ports were placed.  The pelvis was inspected.  The abdomen was suctioned of 50 mL of blood.  The left tube and ovary were noted to be normal. Uterus was normal.  The right tube had distal half to third distended consistent with an ectopic pregnancy.  The tube was stabilized and a dilute solution of Pitressin was injected on the serosal surface of the tube overlying the ectopic pregnancy. Using a needlepoint cautery, a linear incision was made overlying ectopic pregnancy and open the ectopic with subsequently removal of the ectopic pregnancy using atraumatic graspers. Bleeding was noted.  The careful inspection of the lumen of the tube, small additional products of conception was then removed.  Once it was felt to have been evacuated completely, small bleeding areas  were carefully cauterized.  The pelvis was irrigated and suctioned.  The tube was inspected and good hemostasis noted.  The fluid was suctioned out of the pelvis and upper abdomen.  The tubes were inspected.  Good hemostasis noted at which time, the procedure was felt to be complete. The lower ports were then removed.  The abdomen was then deflated and infraumbilical site removed.  The lower incisions were closed with 4-0 Vicryl and Dermabond.  The infraumbilical site was closed with 0 Vicryl figure-of-eight at the fascia stitch, 4-0 Vicryl subcuticular stitch with  Dermabond.  The instruments in the vagina were all removed and the Foley catheter was removed in the operating room.  SPECIMEN:  Products of conception from the right tube sent to pathology.  ESTIMATED BLOOD LOSS:  For the procedure was minimal.  COMPLICATION:  None.  INTRAOPERATIVE FLUIDS:  A liter.  URINE OUTPUT:  About 300 mL.  Sponge and instrument counts x2 was correct.  The patient tolerated the procedure well, was transferred to recovery in stable condition.     Maxie Better, M.D.     [place]/MEDQ  D:  04/01/2011  T:  04/02/2011  Job:  [id number]

## 2011-04-03 ENCOUNTER — Encounter (HOSPITAL_COMMUNITY): Payer: Self-pay | Admitting: Obstetrics and Gynecology

## 2012-02-01 ENCOUNTER — Other Ambulatory Visit: Payer: Self-pay | Admitting: Obstetrics & Gynecology

## 2012-02-01 DIAGNOSIS — O3680X0 Pregnancy with inconclusive fetal viability, not applicable or unspecified: Secondary | ICD-10-CM

## 2012-02-02 ENCOUNTER — Ambulatory Visit (HOSPITAL_COMMUNITY)
Admission: RE | Admit: 2012-02-02 | Discharge: 2012-02-02 | Disposition: A | Discharge status: Home / Self Care | Payer: 59 | Source: Ambulatory Visit | Attending: Obstetrics & Gynecology | Admitting: Obstetrics & Gynecology

## 2012-02-02 DIAGNOSIS — Z3689 Encounter for other specified antenatal screening: Secondary | ICD-10-CM | POA: Insufficient documentation

## 2012-02-02 DIAGNOSIS — O3680X0 Pregnancy with inconclusive fetal viability, not applicable or unspecified: Secondary | ICD-10-CM

## 2012-02-14 NOTE — L&D Delivery Note (Signed)
Delivery Note At 6:57 PM a viable and healthy female was delivered via Vaginal, Spontaneous Delivery (Presentation: Right Occiput Transverse).  APGAR: 9, 9; weight .pending   Placenta status: Intact, Spontaneous.not sent  Cord: 3 vessels with the following complications: None.  Cord pH: none  Anesthesia: Epidural  Episiotomy: None Lacerations: None Suture Repair: none Est. Blood Loss (mL): 300  Mom to postpartum.  Baby to nursery-stable.  Hafsa Lohn A 09/23/2012, 7:15 PM

## 2012-08-28 ENCOUNTER — Encounter (HOSPITAL_COMMUNITY): Payer: Self-pay

## 2012-08-28 ENCOUNTER — Inpatient Hospital Stay (HOSPITAL_COMMUNITY)
Admission: AD | Admit: 2012-08-28 | Discharge: 2012-08-28 | Disposition: A | Discharge status: Home / Self Care | Payer: 59 | Source: Ambulatory Visit | Attending: Obstetrics and Gynecology | Admitting: Obstetrics and Gynecology

## 2012-08-28 ENCOUNTER — Inpatient Hospital Stay (HOSPITAL_COMMUNITY): Payer: 59

## 2012-08-28 DIAGNOSIS — O99891 Other specified diseases and conditions complicating pregnancy: Secondary | ICD-10-CM | POA: Insufficient documentation

## 2012-08-28 DIAGNOSIS — W108XXA Fall (on) (from) other stairs and steps, initial encounter: Secondary | ICD-10-CM | POA: Insufficient documentation

## 2012-08-28 DIAGNOSIS — Y92009 Unspecified place in unspecified non-institutional (private) residence as the place of occurrence of the external cause: Secondary | ICD-10-CM | POA: Insufficient documentation

## 2012-08-28 LAB — URINALYSIS, ROUTINE W REFLEX MICROSCOPIC
Bilirubin Urine: NEGATIVE
Glucose, UA: NEGATIVE mg/dL
Ketones, ur: NEGATIVE mg/dL
Leukocytes, UA: NEGATIVE
pH: 7 (ref 5.0–8.0)

## 2012-08-28 NOTE — MAU Provider Note (Signed)
History     Chief Complaint  Patient presents with  . Fall   32 yo G5P1031 BF now @ 36 2/[redacted] weeks gestation presents for prolonged monitoring due to fell down stairs. Pt denies directly hitting abdomen. (+) irreg ctx  Had decreased FM in am however only drank 2 glasses of water  OB History   Grav Para Term Preterm Abortions TAB SAB Ect Mult Living   5 1 1  0 3 1 1 1  0 1      Past Medical History  Diagnosis Date  . Ectopic pregnancy Feb 2013  . Urinary tract infection   . Ovarian cyst   . Ectopic pregnancy, tubal 04/01/2011    Past Surgical History  Procedure Laterality Date  . Induced abortion      x 2  . Laparoscopy  04/01/2011    Procedure: LAPAROSCOPY OPERATIVE;  Surgeon: Serita Kyle, MD;  Location: [facility];  Service: Gynecology;  Laterality: Right;  Operative Laparoscopy with Right Salpingostomy  . Dilation and curettage of uterus      Family History  Problem Relation Age of Onset  . Anesthesia problems Neg Hx   . Asthma Neg Hx   . Diabetes Neg Hx   . Heart disease Neg Hx   . Hypertension Neg Hx     History  Substance Use Topics  . Smoking status: Never Smoker   . Smokeless tobacco: Never Used  . Alcohol Use: No    Allergies:  Allergies  Allergen Reactions  . Sulfa Antibiotics Rash    Prescriptions prior to admission  Medication Sig Dispense Refill  . hydrocortisone cream 1 % Apply 1 application topically 2 (two) times daily.      . IRON PO Take 1 tablet by mouth daily.      . Prenatal Vit-Fe Fumarate-FA (PRENATAL MULTIVITAMIN) TABS Take 1 tablet by mouth daily at 12 noon.         Physical Exam   Blood pressure 132/72, pulse 100, resp. rate 16, last menstrual period 12/18/2011, SpO2 98.00%.  General appearance: alert, cooperative and no distress Abdomen: gravid soft nontender Pelvic: closed/60/-3 posterior Extremities: no edema, redness or tenderness in the calves or thighs  Tracing reactive  occ ctx   ED Course  Long/P: S/p Fall IUP @  36 2/7 weeks  P) complete 4 hr monitor. sono check placenta. Labor prec. Keep sched OB appt  MDM   Pam Georgi A, MD 1:50 PM 08/28/2012

## 2012-08-28 NOTE — MAU Note (Signed)
Larey Seat today on stairs, hit buttocks, not abd, feeing FM

## 2012-08-28 NOTE — MAU Note (Signed)
Fell down stairs this am hitting her buttocks, +FM, no bleeding or lof.

## 2012-09-23 ENCOUNTER — Inpatient Hospital Stay (HOSPITAL_COMMUNITY)
Admission: AD | Admit: 2012-09-23 | Discharge: 2012-09-25 | DRG: 775 | Disposition: A | Discharge status: Home / Self Care | Payer: 59 | Source: Ambulatory Visit | Attending: Obstetrics and Gynecology | Admitting: Obstetrics and Gynecology

## 2012-09-23 ENCOUNTER — Encounter (HOSPITAL_COMMUNITY): Payer: Self-pay | Admitting: Anesthesiology

## 2012-09-23 ENCOUNTER — Encounter (HOSPITAL_COMMUNITY): Payer: Self-pay | Admitting: *Deleted

## 2012-09-23 ENCOUNTER — Inpatient Hospital Stay (HOSPITAL_COMMUNITY): Payer: 59 | Admitting: Anesthesiology

## 2012-09-23 DIAGNOSIS — Z22330 Carrier of Group B streptococcus: Secondary | ICD-10-CM

## 2012-09-23 DIAGNOSIS — O99892 Other specified diseases and conditions complicating childbirth: Principal | ICD-10-CM | POA: Diagnosis present

## 2012-09-23 LAB — RPR: RPR Ser Ql: NONREACTIVE

## 2012-09-23 LAB — CBC
Hemoglobin: 10.7 g/dL — ABNORMAL LOW (ref 12.0–15.0)
MCV: 67.7 fL — ABNORMAL LOW (ref 78.0–100.0)
Platelets: 263 10*3/uL (ref 150–400)
RBC: 4.83 MIL/uL (ref 3.87–5.11)
WBC: 10.8 10*3/uL — ABNORMAL HIGH (ref 4.0–10.5)

## 2012-09-23 LAB — OB RESULTS CONSOLE RUBELLA ANTIBODY, IGM: Rubella: IMMUNE

## 2012-09-23 LAB — OB RESULTS CONSOLE GC/CHLAMYDIA
Chlamydia: NEGATIVE
Gonorrhea: NEGATIVE

## 2012-09-23 MED ORDER — EPHEDRINE 5 MG/ML INJ
10.0000 mg | INTRAVENOUS | Status: DC | PRN
  Filled 2012-09-23: qty 2

## 2012-09-23 MED ORDER — LACTATED RINGERS IV SOLN: 500.0000 mL | INTRAVENOUS | Status: DC | PRN

## 2012-09-23 MED ORDER — DIPHENHYDRAMINE HCL 25 MG PO CAPS: 25.0000 mg | ORAL_CAPSULE | Freq: Four times a day (QID) | ORAL | Status: DC | PRN

## 2012-09-23 MED ORDER — PHENYLEPHRINE 40 MCG/ML (10ML) SYRINGE FOR IV PUSH (FOR BLOOD PRESSURE SUPPORT)
80.0000 ug | PREFILLED_SYRINGE | INTRAVENOUS | Status: DC | PRN
  Filled 2012-09-23: qty 2

## 2012-09-23 MED ORDER — CITRIC ACID-SODIUM CITRATE 334-500 MG/5ML PO SOLN: 30.0000 mL | ORAL | Status: DC | PRN

## 2012-09-23 MED ORDER — FERROUS SULFATE 325 (65 FE) MG PO TABS
325.0000 mg | ORAL_TABLET | Freq: Two times a day (BID) | ORAL | Status: DC
  Administered 2012-09-24 – 2012-09-25 (×4): 325 mg via ORAL
  Filled 2012-09-23 (×4): qty 1

## 2012-09-23 MED ORDER — BENZOCAINE-MENTHOL 20-0.5 % EX AERO
1.0000 "application " | INHALATION_SPRAY | CUTANEOUS | Status: DC | PRN
  Filled 2012-09-23: qty 56

## 2012-09-23 MED ORDER — DIPHENHYDRAMINE HCL 50 MG/ML IJ SOLN: 12.5000 mg | INTRAMUSCULAR | Status: DC | PRN

## 2012-09-23 MED ORDER — OXYTOCIN 40 UNITS IN LACTATED RINGERS INFUSION - SIMPLE MED: 62.5000 mL/h | INTRAVENOUS | Status: DC

## 2012-09-23 MED ORDER — ONDANSETRON HCL 4 MG/2ML IJ SOLN: 4.0000 mg | INTRAMUSCULAR | Status: DC | PRN

## 2012-09-23 MED ORDER — EPHEDRINE 5 MG/ML INJ
10.0000 mg | INTRAVENOUS | Status: DC | PRN
  Filled 2012-09-23: qty 2
  Filled 2012-09-23: qty 4

## 2012-09-23 MED ORDER — ONDANSETRON HCL 4 MG PO TABS: 4.0000 mg | ORAL_TABLET | ORAL | Status: DC | PRN

## 2012-09-23 MED ORDER — SIMETHICONE 80 MG PO CHEW: 80.0000 mg | CHEWABLE_TABLET | ORAL | Status: DC | PRN

## 2012-09-23 MED ORDER — NALBUPHINE HCL 10 MG/ML IJ SOLN: 10.0000 mg | INTRAMUSCULAR | Status: DC | PRN

## 2012-09-23 MED ORDER — DIBUCAINE 1 % RE OINT: 1.0000 "application " | TOPICAL_OINTMENT | RECTAL | Status: DC | PRN

## 2012-09-23 MED ORDER — LACTATED RINGERS IV SOLN
INTRAVENOUS | Status: DC
  Administered 2012-09-23 (×2): via INTRAVENOUS

## 2012-09-23 MED ORDER — PRENATAL MULTIVITAMIN CH
1.0000 | ORAL_TABLET | Freq: Every day | ORAL | Status: DC
  Administered 2012-09-24 – 2012-09-25 (×2): 1 via ORAL
  Filled 2012-09-23: qty 1

## 2012-09-23 MED ORDER — LIDOCAINE HCL (PF) 1 % IJ SOLN
INTRAMUSCULAR | Status: DC | PRN
  Administered 2012-09-23 (×4): 4 mL

## 2012-09-23 MED ORDER — LACTATED RINGERS IV SOLN: 500.0000 mL | Freq: Once | INTRAVENOUS | Status: DC

## 2012-09-23 MED ORDER — NALBUPHINE SYRINGE 5 MG/0.5 ML
10.0000 mg | INJECTION | INTRAMUSCULAR | Status: DC | PRN
  Filled 2012-09-23: qty 1

## 2012-09-23 MED ORDER — PHENYLEPHRINE 40 MCG/ML (10ML) SYRINGE FOR IV PUSH (FOR BLOOD PRESSURE SUPPORT)
80.0000 ug | PREFILLED_SYRINGE | INTRAVENOUS | Status: DC | PRN
  Filled 2012-09-23: qty 5
  Filled 2012-09-23: qty 2

## 2012-09-23 MED ORDER — OXYCODONE-ACETAMINOPHEN 5-325 MG PO TABS
1.0000 | ORAL_TABLET | ORAL | Status: DC | PRN
  Administered 2012-09-24 – 2012-09-25 (×4): 1 via ORAL
  Filled 2012-09-23 (×4): qty 1

## 2012-09-23 MED ORDER — FENTANYL 2.5 MCG/ML BUPIVACAINE 1/10 % EPIDURAL INFUSION (WH - ANES)
14.0000 mL/h | INTRAMUSCULAR | Status: DC | PRN
  Administered 2012-09-23: 14 mL/h via EPIDURAL
  Filled 2012-09-23: qty 125

## 2012-09-23 MED ORDER — PENICILLIN G POTASSIUM 5000000 UNITS IJ SOLR
5.0000 10*6.[IU] | Freq: Once | INTRAVENOUS | Status: AC
  Administered 2012-09-23: 5 10*6.[IU] via INTRAVENOUS
  Filled 2012-09-23: qty 5

## 2012-09-23 MED ORDER — OXYTOCIN 40 UNITS IN LACTATED RINGERS INFUSION - SIMPLE MED
1.0000 m[IU]/min | INTRAVENOUS | Status: DC
  Administered 2012-09-23: 2 m[IU]/min via INTRAVENOUS
  Administered 2012-09-23: 666 m[IU]/min via INTRAVENOUS
  Filled 2012-09-23: qty 1000

## 2012-09-23 MED ORDER — IBUPROFEN 600 MG PO TABS: 600.0000 mg | ORAL_TABLET | Freq: Four times a day (QID) | ORAL | Status: DC | PRN

## 2012-09-23 MED ORDER — ONDANSETRON HCL 4 MG/2ML IJ SOLN: 4.0000 mg | Freq: Four times a day (QID) | INTRAMUSCULAR | Status: DC | PRN

## 2012-09-23 MED ORDER — OXYCODONE-ACETAMINOPHEN 5-325 MG PO TABS: 1.0000 | ORAL_TABLET | ORAL | Status: DC | PRN

## 2012-09-23 MED ORDER — LIDOCAINE HCL (PF) 1 % IJ SOLN
30.0000 mL | INTRAMUSCULAR | Status: DC | PRN
  Filled 2012-09-23 (×2): qty 30

## 2012-09-23 MED ORDER — PENICILLIN G POTASSIUM 5000000 UNITS IJ SOLR
2.5000 10*6.[IU] | INTRAMUSCULAR | Status: DC
  Administered 2012-09-23: 2.5 10*6.[IU] via INTRAVENOUS
  Filled 2012-09-23 (×4): qty 2.5

## 2012-09-23 MED ORDER — ACETAMINOPHEN 325 MG PO TABS: 650.0000 mg | ORAL_TABLET | ORAL | Status: DC | PRN

## 2012-09-23 MED ORDER — WITCH HAZEL-GLYCERIN EX PADS: 1.0000 "application " | MEDICATED_PAD | CUTANEOUS | Status: DC | PRN

## 2012-09-23 MED ORDER — OXYTOCIN BOLUS FROM INFUSION: 500.0000 mL | INTRAVENOUS | Status: DC

## 2012-09-23 MED ORDER — LANOLIN HYDROUS EX OINT: TOPICAL_OINTMENT | CUTANEOUS | Status: DC | PRN

## 2012-09-23 MED ORDER — SENNOSIDES-DOCUSATE SODIUM 8.6-50 MG PO TABS
2.0000 | ORAL_TABLET | Freq: Every day | ORAL | Status: DC
  Administered 2012-09-23 – 2012-09-24 (×2): 2 via ORAL

## 2012-09-23 MED ORDER — IBUPROFEN 600 MG PO TABS
600.0000 mg | ORAL_TABLET | Freq: Four times a day (QID) | ORAL | Status: DC
  Administered 2012-09-23 – 2012-09-25 (×8): 600 mg via ORAL
  Filled 2012-09-23 (×8): qty 1

## 2012-09-23 MED ORDER — TERBUTALINE SULFATE 1 MG/ML IJ SOLN: 0.2500 mg | Freq: Once | INTRAMUSCULAR | Status: DC | PRN

## 2012-09-23 MED ORDER — ZOLPIDEM TARTRATE 5 MG PO TABS: 5.0000 mg | ORAL_TABLET | Freq: Every evening | ORAL | Status: DC | PRN

## 2012-09-23 NOTE — Progress Notes (Signed)
S: called  For c/o increased rectal pressure and fully dilated. SROM @ 2:30 pm clear fluid and progressed well thereafter with  Pitocin augmentation  O:Pitocin 8 miu   VE fully (+)1- 2 station  Tracing; cat 1 Ctx q 2 mins  IMP; complete GBS cx (+) IV PCN P) start pushing. Anticipate SVD

## 2012-09-23 NOTE — Anesthesia Procedure Notes (Signed)
Epidural Patient location during procedure: OB Start time: 09/23/2012 3:11 PM  Staffing Performed by: anesthesiologist   Preanesthetic Checklist Completed: patient identified, site marked, surgical consent, pre-op evaluation, timeout performed, IV checked, risks and benefits discussed and monitors and equipment checked  Epidural Patient position: sitting Prep: site prepped and draped and DuraPrep Patient monitoring: continuous pulse ox and blood pressure Approach: midline Injection technique: LOR air  Needle:  Needle type: Tuohy  Needle gauge: 17 G Needle length: 9 cm and 9 Needle insertion depth: 5.5 cm Catheter type: closed end flexible Catheter size: 19 Gauge Catheter at skin depth: 10.5 cm Test dose: negative  Assessment Events: blood not aspirated, injection not painful, no injection resistance, negative IV test and no paresthesia  Additional Notes Discussed risk of headache, infection, bleeding, nerve injury and failed or incomplete block.  Patient voices understanding and wishes to proceed.  Epidural placed easily on first attempt.  No paresthesia.  Patient tolerated procedure well with no apparent complications.  Jasmine December, MD Reason for block:procedure for pain

## 2012-09-23 NOTE — H&P (Signed)
Jemila Camille Patty is a 32 y.o. female presenting for admission at term in early labor (+) GBS intact membrane. Bloody show History OB History   Grav Para Term Preterm Abortions TAB SAB Ect Mult Living   5 1 1  0 3 1 1 1  0 1     Past Medical History  Diagnosis Date  . Ectopic pregnancy Feb 2013  . Urinary tract infection   . Ovarian cyst   . Ectopic pregnancy, tubal 04/01/2011   Past Surgical History  Procedure Laterality Date  . Induced abortion      x 2  . Laparoscopy  04/01/2011    Procedure: LAPAROSCOPY OPERATIVE;  Surgeon: Serita Kyle, MD;  Location: [facility];  Service: Gynecology;  Laterality: Right;  Operative Laparoscopy with Right Salpingostomy  . Dilation and curettage of uterus     Family History: family history is negative for Anesthesia problems, and Asthma, and Diabetes, and Heart disease, and Hypertension, . Social History:  reports that she has never smoked. She has never used smokeless tobacco. She reports that she does not drink alcohol or use illicit drugs.   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 rate 20, height 5\' 5"  (1.651 m), weight 89.812 kg (198 lb), last menstrual period 12/18/2011. Maternal Exam:  Uterine Assessment: Contraction strength is moderate.  Abdomen: Patient reports no abdominal tenderness. Estimated fetal weight is 7 `1./2.   Fetal presentation: vertex  Pelvis: adequate for delivery.   Cervix: Cervix evaluated by digital exam.     Physical Exam  Constitutional: She is oriented to  person, place, and time. She appears well-developed and well-nourished.  Eyes: EOM are normal.  Neck: Neck supple.  Cardiovascular: Normal rate and regular rhythm.   Respiratory: Breath sounds normal.  GI: Soft.  Musculoskeletal: She exhibits edema.  Neurological: She is alert and oriented to person, place, and time.  Skin: Skin is warm and dry.  Psychiatric: She has a normal mood and affect.   VE: 3/60/-3 Prenatal labs: ABO, Rh: A/Positive/-- (08/11 1106) Antibody:  neg Rubella: Immune (08/11 1106) RPR: Nonreactive (08/11 1106)  HBsAg: Negative (08/11 1109)  HIV: Non-reactive (08/11 1106)  GBS: Positive (08/11 1107)   Assessment/Plan Term gestation Early labor GBS cx (+) P ) admit routine labs IV PCN pitocin prn. Amniotomy prn Analgesic prn  [place] Rosamond A 09/23/2012, 12:32 PM

## 2012-09-23 NOTE — Anesthesia Preprocedure Evaluation (Signed)
Anesthesia Evaluation  Patient identified by MRN, date of birth, ID band Patient awake    Reviewed: Allergy & Precautions, H&P , NPO status , Patient's Chart, lab work & pertinent test results, reviewed documented beta blocker date and time   History of Anesthesia Complications Negative for: history of anesthetic complications  Airway Mallampati: III TM Distance: >3 FB Neck ROM: full    Dental  (+) Teeth Intact   Pulmonary neg pulmonary ROS,  breath sounds clear to auscultation        Cardiovascular negative cardio ROS  Rhythm:regular Rate:Normal     Neuro/Psych negative neurological ROS  negative psych ROS   GI/Hepatic negative GI ROS, Neg liver ROS,   Endo/Other  negative endocrine ROS  Renal/GU negative Renal ROS     Musculoskeletal   Abdominal   Peds  Hematology  (+) anemia ,   Anesthesia Other Findings   Reproductive/Obstetrics (+) Pregnancy                           Anesthesia Physical Anesthesia Plan  ASA: II  Anesthesia Plan: Epidural   Post-op Pain Management:    Induction:   Airway Management Planned:   Additional Equipment:   Intra-op Plan:   Post-operative Plan:   Informed Consent: I have reviewed the patients History and Physical, chart, labs and discussed the procedure including the risks, benefits and alternatives for the proposed anesthesia with the patient or authorized representative who has indicated his/her understanding and acceptance.     Plan Discussed with:   Anesthesia Plan Comments:         Anesthesia Quick Evaluation  

## 2012-09-23 NOTE — Progress Notes (Signed)
Shellie Rogoff Glinski is a 32 y.o. [id number] at [redacted]w[redacted]d by ultrasound admitted for active labor  Subjective: No chief complaint on file. Breathing with ctx  Objective: BP 121/66  Pulse 95  Temp(Src) 98.3 F (36.8 C) (Oral)  Resp 18  Ht 5\' 5"  (1.651 m)  Wt 89.812 kg (198 lb)  BMI 32.95 kg/m2  LMP 12/18/2011      FHT:  FHR: 140 bpm, variability: moderate,  accelerations:  Present,  decelerations:  Absent UC:   regular, every 5-6 minutes SVE:   3/60/-3 lateral Tracing: cat 1  Labs: Lab Results  Component Value Date   WBC 10.8* 09/23/2012   HGB 10.7* 09/23/2012   HCT 32.7* 09/23/2012   MCV 67.7* 09/23/2012   PLT 263 09/23/2012    Assessment / Plan: Term gestation Early labor GBS cx (+) on IV PCN  P) start pitocin augmentation. Amniotomy after 2nd dose of PItocin. Epidural in active labor.  Anticipated MOD:  NSVD  Faige Seely A 09/23/2012, 12:58 PM

## 2012-09-24 LAB — CBC
Hemoglobin: 9.2 g/dL — ABNORMAL LOW (ref 12.0–15.0)
Platelets: 224 10*3/uL (ref 150–400)
RBC: 4.14 MIL/uL (ref 3.87–5.11)
WBC: 11.3 10*3/uL — ABNORMAL HIGH (ref 4.0–10.5)

## 2012-09-24 MED ORDER — ERYTHROMYCIN 5 MG/GM OP OINT
TOPICAL_OINTMENT | OPHTHALMIC | Status: AC
  Filled 2012-09-24: qty 1

## 2012-09-24 NOTE — Anesthesia Postprocedure Evaluation (Signed)
  Anesthesia Post-op Note  Patient: [name]  Procedure(s) Performed: * No procedures listed *  Patient Location: PACU and Mother/Baby  Anesthesia Type:Epidural  Level of Consciousness: awake, alert  and oriented  Airway and Oxygen Therapy: Patient Spontanous Breathing  Post-op Pain: none  Post-op Assessment: Post-op Vital signs reviewed, Patient's Cardiovascular Status Stable, No headache, No backache, No residual numbness and No residual motor weakness  Post-op Vital Signs: Reviewed and stable  Complications: No apparent anesthesia complications

## 2012-09-24 NOTE — Progress Notes (Signed)
Patient ID: [name], female   DOB: 1980-04-07, 33 y.o.   MRN: [id number] PPD # 1 SVD  S:  Reports feeling well             Tolerating po/ No nausea or vomiting             Bleeding is light             Pain controlled with ibuprofen (OTC)             Up ad lib / ambulatory / voiding without difficulties    Newborn  Information for the patient's newborn:  [name], [name] [[id number]]  female  breast feeding   O:  A & O x 3, in no apparent distress, very pleasant             VS:  Filed Vitals:   09/23/12 2045 09/23/12 2145 09/24/12 0155 09/24/12 0720  BP: 124/75 116/75 116/68 114/71  Pulse: 105 102 97 84  Temp: 98.2 F (36.8 C) 98.8 F (37.1 C) 98.5 F (36.9 C) 98.9 F (37.2 C)  TempSrc: Oral Oral Oral Oral  Resp: 18 18 18 18   Height:      Weight:      SpO2: 98% 98% 98%     LABS:  Recent Labs  09/23/12 1115 09/24/12 0605  WBC 10.8* 11.3*  HGB 10.7* 9.2*  HCT 32.7* 28.1*  PLT 263 224    Blood type: A/Positive/-- (08/11 1106)  Rubella: Immune (08/11 1106)    Lungs: Clear and unlabored  Heart: regular rate and rhythm / no murmurs  Abdomen: soft, non-tender, non-distended, normal bowel sounds             Fundus: firm, non-tender, U-2  Perineum: intact   Lochia: minimal  Extremities: no edema, no calf pain or tenderness, no Homans    A/P: PPD # 1  32 y.o., [id number]   Principal Problem:   Postpartum care following vaginal delivery (8/11) Active Problems:   SVD (spontaneous vaginal delivery)   Doing well - stable status  D/C orders for TED hose - not needed  Routine post partum orders  Anticipate discharge tomorrow    Raelyn Mora, M, MSN, CNM 09/24/2012, 9:09 AM

## 2012-09-25 ENCOUNTER — Encounter (HOSPITAL_COMMUNITY): Payer: Self-pay | Admitting: Obstetrics and Gynecology

## 2012-09-25 MED ORDER — OXYCODONE-ACETAMINOPHEN 5-325 MG PO TABS: 1.0000 | ORAL_TABLET | ORAL | Status: DC | PRN

## 2012-09-25 MED ORDER — IBUPROFEN 600 MG PO TABS: 600.0000 mg | ORAL_TABLET | Freq: Four times a day (QID) | ORAL | Status: DC

## 2012-09-25 NOTE — Discharge Summary (Signed)
Obstetric Discharge Summary Reason for Admission: onset of labor Prenatal Procedures: none Intrapartum Procedures: GBS prophylaxis, pitocin augmentation, epidural Postpartum Procedures: none Complications-Operative and Postpartum: none Hemoglobin  Date Value Range Status  09/24/2012 9.2* 12.0 - 15.0 g/dL Final     HCT  Date Value Range Status  09/24/2012 28.1* 36.0 - 46.0 % Final    Physical Exam:  General: alert and cooperative Lochia: appropriate Uterine Fundus: firm DVT Evaluation: No evidence of DVT seen on physical exam. Negative Homan's sign. Calf/Ankle edema is present-trace  Discharge Diagnoses: Term Pregnancy-delivered  Discharge Information: Date: 09/25/2012 Activity: pelvic rest Diet: routine Medications: PNV, Ibuprofen and Percocet Condition: stable Instructions: refer to practice specific booklet Discharge to: home Follow-up Information   Follow up with [name],[name], MD. Schedule an appointment as soon as possible for a visit in 6 weeks.   Specialty:  Obstetrics and Gynecology   Contact information:   [address] [place] [address] [phone]       Newborn Data: Live born female on 09/23/12 Birth Weight: 7 lb 9 oz (3430 g) APGAR: 9, 9  Home with mother.  Aaric Dolph, N 09/25/2012, 10:00 AM

## 2012-09-25 NOTE — Progress Notes (Signed)
PPD #2- SVD  Subjective:   Reports feeling good Tolerating po/ No nausea or vomiting Bleeding is light Pain controlled with Motrin and Percocet Up ad lib / ambulatory / voiding without problems Newborn: breastfeeding   Objective:   VS: Temp:  [97.5 F (36.4 C)] 97.5 F (36.4 C) (08/13 0530) Pulse Rate:  [80-91] 91 (08/13 0530) Resp:  [18] 18 (08/13 0530) BP: (122-124)/(71-78) 124/78 mmHg (08/13 0530)  LABS:  Recent Labs  09/23/12 1115 09/24/12 0605  WBC 10.8* 11.3*  HGB 10.7* 9.2*  PLT 263 224   Blood type: A/Positive/-- (08/11 1106) Rubella: Immune (08/11 1106)                I&O: Intake/Output     08/12 0701 - 08/13 0700 08/13 0701 - 08/14 0700   Blood     Total Output       Net              Physical Exam: Alert and oriented X3 Abdomen: soft, non-tender, non-distended  Fundus: firm, non-tender, U-2 Perineum: intact, no edema or erythema Lochia: small Extremities: trace edema to bilateral lower extremities, no calf pain or tenderness    Assessment: PPD # 2 G6P2042/ S/P:spontaneous vaginal Doing well - stable for discharge home   Plan: Discharge home RX's:  Ibuprofen 600mg  po Q 6 hrs prn pain #30 Refill x 0 Percocet 5/325 1 to 2 po Q 4 hrs prn pain #20 No refill Routine pp visit in 6wks WOB/GYN booklet given    Donette Larry, N MSN, CNM 09/25/2012, 9:56 AM

## 2012-09-26 ENCOUNTER — Ambulatory Visit (HOSPITAL_COMMUNITY): Payer: 59

## 2012-09-27 ENCOUNTER — Ambulatory Visit (HOSPITAL_COMMUNITY): Admission: RE | Admit: 2012-09-27 | Payer: 59 | Source: Ambulatory Visit

## 2012-09-30 ENCOUNTER — Ambulatory Visit (HOSPITAL_COMMUNITY): Admission: RE | Admit: 2012-09-30 | Payer: 59 | Source: Ambulatory Visit

## 2013-12-15 ENCOUNTER — Encounter (HOSPITAL_COMMUNITY): Payer: Self-pay | Admitting: Obstetrics and Gynecology

## 2014-01-29 ENCOUNTER — Other Ambulatory Visit: Payer: Self-pay | Admitting: Obstetrics and Gynecology

## 2014-02-16 ENCOUNTER — Other Ambulatory Visit (HOSPITAL_COMMUNITY): Payer: Self-pay | Admitting: *Deleted

## 2014-02-16 ENCOUNTER — Ambulatory Visit: Payer: 59 | Admitting: Dietician

## 2014-02-16 ENCOUNTER — Other Ambulatory Visit (HOSPITAL_COMMUNITY): Payer: Self-pay | Admitting: Obstetrics and Gynecology

## 2014-02-20 ENCOUNTER — Encounter (HOSPITAL_COMMUNITY): Admission: RE | Admit: 2014-02-20 | Payer: 59 | Source: Ambulatory Visit

## 2014-09-18 ENCOUNTER — Ambulatory Visit: Payer: Self-pay | Admitting: Skilled Nursing Facility1

## 2014-10-21 ENCOUNTER — Ambulatory Visit: Payer: Self-pay | Admitting: Skilled Nursing Facility1

## 2015-02-14 NOTE — L&D Delivery Note (Signed)
Delivery Note At 11:19 PM a healthy female was delivered via Vaginal, Spontaneous Delivery (Presentation: Occiput ant).  APGAR: 8, 9; weight pending.   Placenta status: complete, 3 Vs.  Cord:  with the following complications: 1 loose [place], no Cx.  Cord pH: N/A  Anesthesia: Epidural Episiotomy: None Lacerations: 1st degree Suture Repair: 3.0 vicryl Est. Blood Loss (mL): 200  Mom to postpartum.  Baby to Couplet care / Skin to Skin.  [name] 12/24/2015, 11:36 PM

## 2015-05-31 LAB — OB RESULTS CONSOLE RPR: RPR: NONREACTIVE

## 2015-05-31 LAB — OB RESULTS CONSOLE GC/CHLAMYDIA
Chlamydia: NEGATIVE
Gonorrhea: NEGATIVE

## 2015-05-31 LAB — OB RESULTS CONSOLE ABO/RH: RH TYPE: POSITIVE

## 2015-05-31 LAB — OB RESULTS CONSOLE HEPATITIS B SURFACE ANTIGEN: HEP B S AG: NEGATIVE

## 2015-05-31 LAB — OB RESULTS CONSOLE ANTIBODY SCREEN: Antibody Screen: NEGATIVE

## 2015-05-31 LAB — OB RESULTS CONSOLE HIV ANTIBODY (ROUTINE TESTING): HIV: NONREACTIVE

## 2015-05-31 LAB — OB RESULTS CONSOLE RUBELLA ANTIBODY, IGM: Rubella: IMMUNE

## 2015-12-15 LAB — OB RESULTS CONSOLE GBS: GBS: POSITIVE

## 2015-12-22 ENCOUNTER — Other Ambulatory Visit: Payer: Self-pay | Admitting: Obstetrics and Gynecology

## 2015-12-23 ENCOUNTER — Telehealth (HOSPITAL_COMMUNITY): Payer: Self-pay | Admitting: *Deleted

## 2015-12-23 ENCOUNTER — Encounter (HOSPITAL_COMMUNITY): Payer: Self-pay | Admitting: *Deleted

## 2015-12-23 NOTE — Telephone Encounter (Signed)
Preadmission screen  

## 2015-12-24 ENCOUNTER — Inpatient Hospital Stay (HOSPITAL_COMMUNITY): Payer: 59 | Admitting: Anesthesiology

## 2015-12-24 ENCOUNTER — Inpatient Hospital Stay (HOSPITAL_COMMUNITY)
Admission: AD | Admit: 2015-12-24 | Discharge: 2015-12-26 | DRG: 775 | Disposition: A | Discharge status: Home / Self Care | Payer: 59 | Source: Ambulatory Visit | Attending: Obstetrics & Gynecology | Admitting: Obstetrics & Gynecology

## 2015-12-24 ENCOUNTER — Encounter (HOSPITAL_COMMUNITY): Payer: Self-pay | Admitting: Anesthesiology

## 2015-12-24 DIAGNOSIS — Z6834 Body mass index (BMI) 34.0-34.9, adult: Secondary | ICD-10-CM

## 2015-12-24 DIAGNOSIS — O9902 Anemia complicating childbirth: Secondary | ICD-10-CM | POA: Diagnosis present

## 2015-12-24 DIAGNOSIS — Z3A39 39 weeks gestation of pregnancy: Secondary | ICD-10-CM

## 2015-12-24 DIAGNOSIS — O99824 Streptococcus B carrier state complicating childbirth: Secondary | ICD-10-CM | POA: Diagnosis present

## 2015-12-24 DIAGNOSIS — O99214 Obesity complicating childbirth: Secondary | ICD-10-CM | POA: Diagnosis present

## 2015-12-24 DIAGNOSIS — E669 Obesity, unspecified: Secondary | ICD-10-CM | POA: Diagnosis present

## 2015-12-24 DIAGNOSIS — Z3403 Encounter for supervision of normal first pregnancy, third trimester: Secondary | ICD-10-CM | POA: Diagnosis present

## 2015-12-24 DIAGNOSIS — D509 Iron deficiency anemia, unspecified: Secondary | ICD-10-CM | POA: Diagnosis present

## 2015-12-24 LAB — CBC
HCT: 30.2 % — ABNORMAL LOW (ref 36.0–46.0)
Hemoglobin: 9.8 g/dL — ABNORMAL LOW (ref 12.0–15.0)
MCH: 22 pg — ABNORMAL LOW (ref 26.0–34.0)
MCHC: 32.5 g/dL (ref 30.0–36.0)
MCV: 67.9 fL — ABNORMAL LOW (ref 78.0–100.0)
Platelets: 244 10*3/uL (ref 150–400)
RBC: 4.45 MIL/uL (ref 3.87–5.11)
RDW: 15.5 % (ref 11.5–15.5)
WBC: 9.4 10*3/uL (ref 4.0–10.5)

## 2015-12-24 LAB — TYPE AND SCREEN
ABO/RH(D): A POS
Antibody Screen: NEGATIVE

## 2015-12-24 MED ORDER — PENICILLIN G POT IN DEXTROSE 60000 UNIT/ML IV SOLN
3.0000 10*6.[IU] | INTRAVENOUS | Status: DC
  Administered 2015-12-24: 3 10*6.[IU] via INTRAVENOUS
  Filled 2015-12-24 (×5): qty 50

## 2015-12-24 MED ORDER — MISOPROSTOL 25 MCG QUARTER TABLET: 25.0000 ug | ORAL_TABLET | ORAL | Status: DC | PRN

## 2015-12-24 MED ORDER — TERBUTALINE SULFATE 1 MG/ML IJ SOLN
0.2500 mg | Freq: Once | INTRAMUSCULAR | Status: DC | PRN
  Filled 2015-12-24: qty 1

## 2015-12-24 MED ORDER — LACTATED RINGERS IV SOLN
INTRAVENOUS | Status: DC
  Administered 2015-12-24 (×2): via INTRAVENOUS

## 2015-12-24 MED ORDER — PHENYLEPHRINE 40 MCG/ML (10ML) SYRINGE FOR IV PUSH (FOR BLOOD PRESSURE SUPPORT)
80.0000 ug | PREFILLED_SYRINGE | INTRAVENOUS | Status: DC | PRN
  Filled 2015-12-24: qty 10
  Filled 2015-12-24: qty 5

## 2015-12-24 MED ORDER — EPHEDRINE 5 MG/ML INJ
10.0000 mg | INTRAVENOUS | Status: DC | PRN
  Filled 2015-12-24: qty 4

## 2015-12-24 MED ORDER — DIPHENHYDRAMINE HCL 50 MG/ML IJ SOLN: 12.5000 mg | INTRAMUSCULAR | Status: DC | PRN

## 2015-12-24 MED ORDER — PENICILLIN G POTASSIUM 5000000 UNITS IJ SOLR
5.0000 10*6.[IU] | Freq: Once | INTRAVENOUS | Status: AC
  Administered 2015-12-24: 5 10*6.[IU] via INTRAVENOUS
  Filled 2015-12-24: qty 5

## 2015-12-24 MED ORDER — LACTATED RINGERS IV SOLN: 500.0000 mL | INTRAVENOUS | Status: DC | PRN

## 2015-12-24 MED ORDER — ZOLPIDEM TARTRATE 5 MG PO TABS: 5.0000 mg | ORAL_TABLET | Freq: Every evening | ORAL | Status: DC | PRN

## 2015-12-24 MED ORDER — OXYCODONE-ACETAMINOPHEN 5-325 MG PO TABS: 1.0000 | ORAL_TABLET | ORAL | Status: DC | PRN

## 2015-12-24 MED ORDER — OXYCODONE-ACETAMINOPHEN 5-325 MG PO TABS: 2.0000 | ORAL_TABLET | ORAL | Status: DC | PRN

## 2015-12-24 MED ORDER — LACTATED RINGERS IV SOLN
500.0000 mL | Freq: Once | INTRAVENOUS | Status: AC
  Administered 2015-12-24: 500 mL via INTRAVENOUS

## 2015-12-24 MED ORDER — ACETAMINOPHEN 325 MG PO TABS: 650.0000 mg | ORAL_TABLET | ORAL | Status: DC | PRN

## 2015-12-24 MED ORDER — SOD CITRATE-CITRIC ACID 500-334 MG/5ML PO SOLN: 30.0000 mL | ORAL | Status: DC | PRN

## 2015-12-24 MED ORDER — FENTANYL 2.5 MCG/ML BUPIVACAINE 1/10 % EPIDURAL INFUSION (WH - ANES)
14.0000 mL/h | INTRAMUSCULAR | Status: DC | PRN
  Administered 2015-12-24 (×2): 14 mL/h via EPIDURAL
  Filled 2015-12-24 (×2): qty 100

## 2015-12-24 MED ORDER — LIDOCAINE HCL (PF) 1 % IJ SOLN
INTRAMUSCULAR | Status: DC | PRN
  Administered 2015-12-24 (×2): 4 mL via EPIDURAL

## 2015-12-24 MED ORDER — PENICILLIN G POTASSIUM 5000000 UNITS IJ SOLR
2.5000 10*6.[IU] | INTRAVENOUS | Status: DC
  Filled 2015-12-24 (×2): qty 2.5

## 2015-12-24 MED ORDER — OXYTOCIN BOLUS FROM INFUSION
500.0000 mL | Freq: Once | INTRAVENOUS | Status: AC
  Administered 2015-12-24: 500 mL via INTRAVENOUS

## 2015-12-24 MED ORDER — OXYTOCIN 40 UNITS IN LACTATED RINGERS INFUSION - SIMPLE MED
2.5000 [IU]/h | INTRAVENOUS | Status: DC
  Filled 2015-12-24: qty 1000

## 2015-12-24 MED ORDER — PHENYLEPHRINE 40 MCG/ML (10ML) SYRINGE FOR IV PUSH (FOR BLOOD PRESSURE SUPPORT)
80.0000 ug | PREFILLED_SYRINGE | INTRAVENOUS | Status: DC | PRN
  Filled 2015-12-24: qty 5

## 2015-12-24 MED ORDER — LIDOCAINE HCL (PF) 1 % IJ SOLN
30.0000 mL | INTRAMUSCULAR | Status: DC | PRN
  Filled 2015-12-24: qty 30

## 2015-12-24 NOTE — Anesthesia Procedure Notes (Signed)
Epidural Patient location during procedure: OB Start time: 12/24/2015 5:12 PM  Staffing Anesthesiologist: Mal AmabileFOSTER, Omero Kowal Performed: anesthesiologist   Preanesthetic Checklist Completed: patient identified, site marked, surgical consent, pre-op evaluation, timeout performed, IV checked, risks and benefits discussed and monitors and equipment checked  Epidural Patient position: sitting Prep: site prepped and draped and DuraPrep Patient monitoring: continuous pulse ox and blood pressure Approach: midline Location: L3-L4 Injection technique: LOR air  Needle:  Needle type: Tuohy  Needle gauge: 17 G Needle length: 9 cm and 9 Needle insertion depth: 5 cm cm Catheter type: closed end flexible Catheter size: 19 Gauge Catheter at skin depth: 10 cm Test dose: negative and Other  Assessment Events: blood not aspirated, injection not painful, no injection resistance, negative IV test and paresthesia  Additional Notes Patient identified. Risks and benefits discussed including failed block, incomplete  Pain control, post dural puncture headache, nerve damage, paralysis, blood pressure Changes, nausea, vomiting, reactions to medications-both toxic and allergic and post Partum back pain. All questions were answered. Patient expressed understanding and wished to proceed. Sterile technique was used throughout procedure. Epidural site was Dressed with sterile barrier dressing. No paresthesias, signs of intravascular injection Or signs of intrathecal spread were encountered. Transient paresthesia left leg. Patient was more comfortable after the epidural was dosed. Please see RN's note for documentation of vital signs and FHR which are stable.

## 2015-12-24 NOTE — Anesthesia Preprocedure Evaluation (Signed)
Anesthesia Evaluation  Patient identified by MRN, date of birth, ID band Patient awake    Reviewed: Allergy & Precautions, NPO status , Patient's Chart, lab work & pertinent test results  Airway Mallampati: III       Dental no notable dental hx. (+) Teeth Intact   Pulmonary neg pulmonary ROS,    Pulmonary exam normal breath sounds clear to auscultation       Cardiovascular negative cardio ROS Normal cardiovascular exam Rhythm:Regular Rate:Normal     Neuro/Psych negative neurological ROS  negative psych ROS   GI/Hepatic negative GI ROS, Neg liver ROS,   Endo/Other  Obesity Vit. D deficiency  Renal/GU negative Renal ROS  negative genitourinary   Musculoskeletal negative musculoskeletal ROS (+)   Abdominal (+) + obese,   Peds  Hematology  (+) anemia ,   Anesthesia Other Findings   Reproductive/Obstetrics (+) Pregnancy                             Anesthesia Physical Anesthesia Plan  ASA: II  Anesthesia Plan: Epidural   Post-op Pain Management:    Induction:   Airway Management Planned: Natural Airway  Additional Equipment:   Intra-op Plan:   Post-operative Plan:   Informed Consent: I have reviewed the patients History and Physical, chart, labs and discussed the procedure including the risks, benefits and alternatives for the proposed anesthesia with the patient or authorized representative who has indicated his/her understanding and acceptance.     Plan Discussed with: Anesthesiologist  Anesthesia Plan Comments:         Anesthesia Quick Evaluation

## 2015-12-24 NOTE — H&P (Addendum)
[name] is a 35 y.o. female [phone] 8754w0d presenting for Spontaneous Labor.  HPP/HPI:  Normal pregnancy.  2 previous SVD, no Cx.  FMs pos.  No AF leak.  No vaginal bleeding.  No PEC Sx.  OB History    Gravida Para Term Preterm AB Living   7 2 2  0 4 2   SAB TAB Ectopic Multiple Live Births   0 3 1 0 2     Past Medical History:  Diagnosis Date  . Anemia   . Ectopic pregnancy Feb 2013  . Ectopic pregnancy, tubal 04/01/2011  . Hx of varicella   . Ovarian cyst   . Urinary tract infection   . Vitamin D deficiency    Past Surgical History:  Procedure Laterality Date  . DILATION AND CURETTAGE OF UTERUS    . INDUCED ABORTION     x 2  . LAPAROSCOPY  04/01/2011   Procedure: LAPAROSCOPY OPERATIVE;  Surgeon: Serita KyleSheronette A [name], MD;  Location: [facility];  Service: Gynecology;  Laterality: Right;  Operative Laparoscopy with Right Salpingostomy   Family History: family history includes Cancer in her maternal grandmother. Social History:  reports that she has never smoked. She has never used smokeless tobacco. She reports that she does not drink alcohol or use drugs.  Allergies  Allergen Reactions  . Sulfa Antibiotics Rash    Dilation: 9 Effacement (%): 100 Station: 0 Exam by:: Dr. Seymour BarsLavoie  AROM AF clear  Blood pressure (!) 145/73, pulse 83, temperature 98.2 F (36.8 C), temperature source Oral, resp. rate 18, height 5\' 5"  (1.651 m), weight 206 lb (93.4 kg), last menstrual period 03/26/2015, SpO2 100 %, unknown if currently breastfeeding. Exam Physical Exam   FHR 120's with good variability.  Accelerations present.  No deceleration.  UCs q3-4 min ++.  HPP:  Patient Active Problem List   Diagnosis Date Noted  . Normal labor 12/24/2015  . SVD (spontaneous vaginal delivery) 09/23/2012  . Postpartum care following vaginal delivery (8/11) 09/23/2012  . Ectopic pregnancy, tubal 04/01/2011  . Ectopic pregnancy 03/27/2011    Prenatal labs: ABO, Rh: --/--/A POS (11/10  1635) Antibody: NEG (11/10 1635) Rubella: Immune RPR: Nonreactive (04/17 0000)  HBsAg: Negative (04/17 0000)  HIV: Non-reactive (04/17 0000)  Genetic testing: Informaseq low risk US anato: Isolated EIF 1 hr GTT: wnl GBS: Positive (11/01 0000)   Assessment/Plan: Multiparous 39 wks in Spontaneous Labor.  GBS pos Pen G Covered.  FHR Cat 1.  Pain controled under Epidural.  Preparing for Delivery.   Marie-Lyne Siah Steely 12/24/2015, 8:52 PM

## 2015-12-24 NOTE — Anesthesia Pain Management Evaluation Note (Signed)
  CRNA Pain Management Visit Note  Patient: [name], 35 y.o., female  "Hello I am a member of the anesthesia team at [facility]. We have an anesthesia team available at all times to provide care throughout the hospital, including epidural management and anesthesia for C-section. I don't know your plan for the delivery whether it a natural birth, water birth, IV sedation, nitrous supplementation, doula or epidural, but we want to meet your pain goals."   1.Was your pain managed to your expectations on prior hospitalizations?   No prior hospitalizations  2.What is your expectation for pain management during this hospitalization?     Epidural  3.How can we help you reach that goal? unsure  Record the patient's initial score and the patient's pain goal.   Pain: 0  Pain Goal: 3 [facility] wants you to be able to say your pain was always managed very well.  Cephus ShellingBURGER,Sharne Linders 12/24/2015

## 2015-12-25 ENCOUNTER — Encounter (HOSPITAL_COMMUNITY): Payer: Self-pay

## 2015-12-25 LAB — CBC
HEMATOCRIT: 28.7 % — AB (ref 36.0–46.0)
HEMOGLOBIN: 9.8 g/dL — AB (ref 12.0–15.0)
MCH: 23.1 pg — AB (ref 26.0–34.0)
MCHC: 34.1 g/dL (ref 30.0–36.0)
MCV: 67.5 fL — AB (ref 78.0–100.0)
Platelets: 203 10*3/uL (ref 150–400)
RBC: 4.25 MIL/uL (ref 3.87–5.11)
RDW: 15.6 % — ABNORMAL HIGH (ref 11.5–15.5)
WBC: 15.7 10*3/uL — ABNORMAL HIGH (ref 4.0–10.5)

## 2015-12-25 LAB — RPR: RPR Ser Ql: NONREACTIVE

## 2015-12-25 MED ORDER — ONDANSETRON HCL 4 MG PO TABS: 4.0000 mg | ORAL_TABLET | ORAL | Status: DC | PRN

## 2015-12-25 MED ORDER — IBUPROFEN 600 MG PO TABS
600.0000 mg | ORAL_TABLET | Freq: Four times a day (QID) | ORAL | Status: DC
  Administered 2015-12-25: 600 mg via ORAL
  Filled 2015-12-25: qty 1

## 2015-12-25 MED ORDER — BENZOCAINE-MENTHOL 20-0.5 % EX AERO
1.0000 "application " | INHALATION_SPRAY | CUTANEOUS | Status: DC | PRN
  Administered 2015-12-25: 1 via TOPICAL
  Filled 2015-12-25: qty 56

## 2015-12-25 MED ORDER — SIMETHICONE 80 MG PO CHEW: 80.0000 mg | CHEWABLE_TABLET | ORAL | Status: DC | PRN

## 2015-12-25 MED ORDER — WITCH HAZEL-GLYCERIN EX PADS: 1.0000 "application " | MEDICATED_PAD | CUTANEOUS | Status: DC | PRN

## 2015-12-25 MED ORDER — OXYTOCIN 40 UNITS IN LACTATED RINGERS INFUSION - SIMPLE MED: 2.5000 [IU]/h | INTRAVENOUS | Status: DC | PRN

## 2015-12-25 MED ORDER — IBUPROFEN 800 MG PO TABS
800.0000 mg | ORAL_TABLET | Freq: Three times a day (TID) | ORAL | Status: DC
  Administered 2015-12-25 – 2015-12-26 (×3): 800 mg via ORAL
  Filled 2015-12-25 (×3): qty 1

## 2015-12-25 MED ORDER — DIPHENHYDRAMINE HCL 25 MG PO CAPS: 25.0000 mg | ORAL_CAPSULE | Freq: Four times a day (QID) | ORAL | Status: DC | PRN

## 2015-12-25 MED ORDER — ZOLPIDEM TARTRATE 5 MG PO TABS: 5.0000 mg | ORAL_TABLET | Freq: Every evening | ORAL | Status: DC | PRN

## 2015-12-25 MED ORDER — COCONUT OIL OIL: 1.0000 "application " | TOPICAL_OIL | Status: DC | PRN

## 2015-12-25 MED ORDER — ONDANSETRON HCL 4 MG/2ML IJ SOLN: 4.0000 mg | INTRAMUSCULAR | Status: DC | PRN

## 2015-12-25 MED ORDER — PRENATAL MULTIVITAMIN CH
1.0000 | ORAL_TABLET | Freq: Every day | ORAL | Status: DC
  Administered 2015-12-25 – 2015-12-26 (×2): 1 via ORAL
  Filled 2015-12-25 (×2): qty 1

## 2015-12-25 MED ORDER — OXYCODONE-ACETAMINOPHEN 5-325 MG PO TABS
1.0000 | ORAL_TABLET | ORAL | Status: DC | PRN
  Administered 2015-12-25: 1 via ORAL
  Filled 2015-12-25: qty 1

## 2015-12-25 MED ORDER — DIBUCAINE 1 % RE OINT: 1.0000 "application " | TOPICAL_OINTMENT | RECTAL | Status: DC | PRN

## 2015-12-25 MED ORDER — ACETAMINOPHEN 325 MG PO TABS: 650.0000 mg | ORAL_TABLET | ORAL | Status: DC | PRN

## 2015-12-25 MED ORDER — SENNOSIDES-DOCUSATE SODIUM 8.6-50 MG PO TABS
2.0000 | ORAL_TABLET | ORAL | Status: DC
  Administered 2015-12-25: 2 via ORAL
  Filled 2015-12-25: qty 2

## 2015-12-25 MED ORDER — TETANUS-DIPHTH-ACELL PERTUSSIS 5-2.5-18.5 LF-MCG/0.5 IM SUSP: 0.5000 mL | Freq: Once | INTRAMUSCULAR | Status: DC

## 2015-12-25 NOTE — Lactation Note (Signed)
This note was copied from a baby's chart. Lactation Consultation Note  Patient Name: [name]: 12/25/2015 Reason for consult: Initial assessment Baby is 16 hours, has been to the breast several times average amount of time - 15 mins, and 1 - 5 min feeding.  As LC walked in the room mom mom latching baby vertically in football position right breast. LC offered to assist, LC changed wet and stool diaper. Waited for mom to go  the Bathroom. LC provided adequate pillows, reviewed basics breast massage , hand express, mom repeated and glistening of colostrum noted on the nipple. LC assisted mom with latch in the football position, baby on and off at 1st and then latched , 1-2 swallows noted , even with breast compressions.  Baby sustained latch for approx 15 mins , sluggish pattern noted. LC had mom release suction and nipple well rounded. Baby content and mom holding baby STS with loose blanket on top. LC recommended due to the BF hx and delayed milk coming in with 2nd baby >7 days , after breast massage, hand express, pre- pump to prime the milk ducts to enhance the baby to be more nutritive while feeding. Per mom comfortable with latch.  Also instructed mom on the use of the hand pump and cleaning it .  Mother informed of post-discharge support and given phone number to the lactation department, including services for phone call assistance; out-patient appointments; and breastfeeding support group. List of other breastfeeding resources in the community given in the handout. Encouraged mother to call for problems or concerns related to breastfeeding.  Maternal Data Has patient been taught Hand Expression?: Yes Does the patient have breastfeeding experience prior to this delivery?: Yes  Feeding Feeding Type: Breast Fed  LATCH Score/Interventions Latch: Grasps breast easily, tongue down, lips flanged, rhythmical sucking.  Audible Swallowing: A few with stimulation  Type of  Nipple: Everted at rest and after stimulation  Comfort (Breast/Nipple): Soft / non-tender     Hold (Positioning): Assistance needed to correctly position infant at breast and maintain latch. Intervention(s): Breastfeeding basics reviewed;Support Pillows;Position options;Skin to skin  LATCH Score: 8  Lactation Tools Discussed/Used WIC Program: No   Consult Status Consult Status: Follow-up Long: 12/26/15 Follow-up type: In-patient    [name] 12/25/2015, 4:09 PM

## 2015-12-25 NOTE — Anesthesia Postprocedure Evaluation (Signed)
Anesthesia Post Note  Patient: Milly Jakobbony D Schlotzhauer  Procedure(s) Performed: * No procedures listed *  Patient location during evaluation: Mother Baby Anesthesia Type: Epidural Level of consciousness: awake and alert Pain management: pain level controlled Vital Signs Assessment: post-procedure vital signs reviewed and stable Respiratory status: spontaneous breathing and nonlabored ventilation Cardiovascular status: stable Postop Assessment: no headache, no backache, patient able to bend at knees, epidural receding, no signs of nausea or vomiting and adequate PO intake Anesthetic complications: no     Last Vitals:  Vitals:   12/25/15 0215 12/25/15 0633  BP: (!) 125/56 116/62  Pulse: 90 94  Resp: 18 18  Temp: 37 C 36.9 C    Last Pain:  Vitals:   12/25/15 0800  TempSrc:   PainSc: 7    Pain Goal: Patients Stated Pain Goal: 3 (12/25/15 0800)               Laban EmperorMalinova,Tyaire Odem Hristova

## 2015-12-25 NOTE — Progress Notes (Signed)
PPD 1 SVD with 1st degree repair  S:  Reports feeling well             Tolerating po/ No nausea or vomiting             Bleeding is light             Pain controlled withMotrin - some backache              Up ad lib / ambulatory / voiding QS  Newborn breast feeding well this am - prefers left O:               VS: BP 116/62 (BP Location: Right Arm)   Pulse 94   Temp 98.4 F (36.9 C) (Oral)   Resp 18   Ht 5\' 5"  (1.651 m)   Wt 93.4 kg (206 lb)   LMP 03/26/2015   SpO2 100%   Breastfeeding? Unknown   BMI 34.28 kg/m    LABS:             Recent Labs  12/24/15 1635 12/25/15 0520  WBC 9.4 15.7*  HGB 9.8* 9.8*  PLT 244 203               Blood type: --/--/A POS (11/10 1635)  Rubella: Immune (04/17 0000)                tdap and flu current 2017                      Physical Exam:             Alert and oriented X3  Abdomen: soft, non-tender, non-distended              Fundus: firm, non-tender, Ueven  Perineum: ice pack in place  Lochia: light  Extremities: trace edema, no calf pain or tenderness   A: PPD # 1 with 1st degree repair   Doing well - stable status  P: Routine post partum orders  Dc tomorrow  [name], [name] CNM, MSN, [facility] 12/25/2015, 8:53 AM

## 2015-12-26 MED ORDER — MAGNESIUM OXIDE 400 (241.3 MG) MG PO TABS: 400.0000 mg | ORAL_TABLET | Freq: Every day | ORAL | 0 refills | Status: DC

## 2015-12-26 MED ORDER — IRON POLYSACCH CMPLX-B12-FA 150-0.025-1 MG PO CAPS: 1.0000 | ORAL_CAPSULE | Freq: Every day | ORAL | 0 refills | Status: DC

## 2015-12-26 MED ORDER — IBUPROFEN 800 MG PO TABS: 800.0000 mg | ORAL_TABLET | Freq: Three times a day (TID) | ORAL | 0 refills | Status: DC

## 2015-12-26 NOTE — Lactation Note (Signed)
This note was copied from a baby's chart. Lactation Consultation Note  Patient Name: [name] ZOXWR'UToday's Date: 12/26/2015 Reason for consult: Follow-up assessment;Infant weight loss;Other (Comment) (4% weight loss Bili check 5.0 ) Baby is 36 hours old and has been consistent with breast feeding, per mom cluster feeding on nights.  7 wets , 6 stools, per mom breast are fuller and hearing swallows. Right nipple tender, left nipple clear, LC encouraged EBM to nipples , comfort gels  And when not using the comfort gels, use the shells,  Also prior to latching on the 1st breast - breast massage, hand express, pre - pump to make the nipple  Areola more elastic, then reverse pressure, latch with firm support, breast compressions with latch until swallows.  Sore nipple and engorgement prevention and tx reviewed. Nutritive vs non - nutritive feeding patterns reviewed.  Mother informed of post-discharge support and given phone number to the lactation department, including services for  phone call assistance; out-patient appointments; and breastfeeding support group. List of other breastfeeding resources  in the community given in the handout. Encouraged mother to call for problems or concerns related to breastfeeding.  Maternal Data Has patient been taught Hand Expression?: Yes  Feeding Feeding Type: Breast Fed Length of feed:  (still feeding at 15 mins , with increased swallows )  LATCH Score/Interventions Latch: Grasps breast easily, tongue down, lips flanged, rhythmical sucking. Intervention(s): Adjust position;Assist with latch;Breast massage;Breast compression  Audible Swallowing: A few with stimulation (increased swallows with breast compressions )  Type of Nipple: Everted at rest and after stimulation  Comfort (Breast/Nipple): Soft / non-tender     Hold (Positioning): Assistance needed to correctly position infant at breast and maintain latch. (worked on depth ) Intervention(s):  Breastfeeding basics reviewed;Support Pillows;Position options;Skin to skin  LATCH Score: 8  Lactation Tools Discussed/Used Tools: Shells;Pump;Comfort gels Shell Type: Inverted Breast pump type: Manual Pump Review: Setup, frequency, and cleaning;Milk Storage Initiated by:: MAI  Date initiated:: 12/26/15   Consult Status Consult Status: Complete Date: 12/26/15    Kathrin GreathouseMargaret Ann Zea Kostka 12/26/2015, 12:18 PM

## 2015-12-26 NOTE — Progress Notes (Signed)
PPD 2 SVD with 1st degree repair  S:  Reports feeling well             Tolerating po/ No nausea or vomiting             Bleeding is light             Pain controlled with motrin             Up ad lib / ambulatory / voiding QS  Newborn breast feeding  / Circumcision outpatient per Peds  O: VS: BP 126/72 (BP Location: Right Arm)   Pulse 90   Temp 98.3 F (36.8 C) (Oral)   Resp 18   Ht 5\' 5"  (1.651 m)   Wt 93.4 kg (206 lb)   LMP 03/26/2015   SpO2 100%   Breastfeeding? Unknown   BMI 34.28 kg/m                     Physical Exam:             Alert and oriented X3  Abdomen: soft, non-tender, non-distended              Fundus: firm, non-tender, Ueven  Perineum: no edema  Lochia: light  Extremities: no edema, no calf pain or tenderness  A: PPD # 1 SVD with 1st degree repair   Doing well - stable status  P: Routine post partum orders  DC home - WOB booklet - instructions reviewed  [name], [name] CNM, MSN, [facility] 12/26/2015, 5:34 AM

## 2015-12-26 NOTE — Discharge Summary (Signed)
Obstetric Discharge Summary Reason for Admission: onset of labor Prenatal Procedures: none Intrapartum Procedures: spontaneous vaginal delivery, GBS prophylaxis and epidural Postpartum Procedures: none Complications-Operative and Postpartum: 1st degree perineal laceration Hemoglobin  Date Value Ref Range Status  12/25/2015 9.8 (L) 12.0 - 15.0 g/dL Final   HCT  Date Value Ref Range Status  12/25/2015 28.7 (L) 36.0 - 46.0 % Final    Physical Exam:  General: alert, cooperative and no distress Lochia: appropriate Uterine Fundus: firm Incision: healing well DVT Evaluation: No evidence of DVT seen on physical exam.  Discharge Diagnoses: Term Pregnancy-delivered / IDA of pregnancy with ABL with delivery  Discharge Information: Date: 12/26/2015 Activity: pelvic rest Diet: routine Medications: PNV, Ibuprofen, Iron and magnesium Condition: stable Instructions: refer to practice specific booklet Discharge to: home Follow-up Information    Pam A, MD. Schedule an appointment as soon as possible for Long visit in 6 week(s).   Specialty:  Obstetrics and Gynecology Contact information: [address] Pam KaufmanGreensobo Long [phone]           Newborn Data: Live born female  Birth Weight: 7 lb 8.5 oz (3416 g) APGAR: 8, 9  Home with mother.  Pam MikeBAILEY, Robet Long 12/26/2015, 5:37 AM

## 2015-12-29 ENCOUNTER — Inpatient Hospital Stay (HOSPITAL_COMMUNITY): Admission: RE | Admit: 2015-12-29 | Payer: Self-pay | Source: Ambulatory Visit

## 2016-01-13 ENCOUNTER — Encounter (HOSPITAL_COMMUNITY): Payer: Self-pay

## 2016-01-13 ENCOUNTER — Encounter (HOSPITAL_COMMUNITY): Payer: Self-pay | Admitting: Emergency Medicine

## 2016-01-13 ENCOUNTER — Emergency Department (HOSPITAL_COMMUNITY)
Admission: EM | Admit: 2016-01-13 | Discharge: 2016-01-13 | Disposition: A | Discharge status: Home / Self Care | Payer: 59 | Source: Home / Self Care | Attending: Emergency Medicine | Admitting: Emergency Medicine

## 2016-01-13 ENCOUNTER — Emergency Department (HOSPITAL_COMMUNITY): Payer: 59

## 2016-01-13 ENCOUNTER — Emergency Department (HOSPITAL_COMMUNITY)
Admission: EM | Admit: 2016-01-13 | Discharge: 2016-01-13 | Disposition: A | Discharge status: Home / Self Care | Payer: 59 | Source: Home / Self Care

## 2016-01-13 ENCOUNTER — Inpatient Hospital Stay (HOSPITAL_BASED_OUTPATIENT_CLINIC_OR_DEPARTMENT_OTHER): Payer: 59

## 2016-01-13 ENCOUNTER — Inpatient Hospital Stay (HOSPITAL_COMMUNITY)
Admission: AD | Admit: 2016-01-13 | Discharge: 2016-01-13 | Disposition: A | Discharge status: Home / Self Care | Payer: 59 | Source: Ambulatory Visit | Attending: Obstetrics and Gynecology | Admitting: Obstetrics and Gynecology

## 2016-01-13 ENCOUNTER — Encounter (HOSPITAL_COMMUNITY): Payer: Self-pay | Admitting: *Deleted

## 2016-01-13 DIAGNOSIS — Y939 Activity, unspecified: Secondary | ICD-10-CM | POA: Insufficient documentation

## 2016-01-13 DIAGNOSIS — O99325 Drug use complicating the puerperium: Secondary | ICD-10-CM | POA: Insufficient documentation

## 2016-01-13 DIAGNOSIS — E559 Vitamin D deficiency, unspecified: Secondary | ICD-10-CM | POA: Diagnosis not present

## 2016-01-13 DIAGNOSIS — D649 Anemia, unspecified: Secondary | ICD-10-CM | POA: Insufficient documentation

## 2016-01-13 DIAGNOSIS — Z79899 Other long term (current) drug therapy: Secondary | ICD-10-CM | POA: Insufficient documentation

## 2016-01-13 DIAGNOSIS — M79604 Pain in right leg: Secondary | ICD-10-CM | POA: Insufficient documentation

## 2016-01-13 DIAGNOSIS — Z5321 Procedure and treatment not carried out due to patient leaving prior to being seen by health care provider: Secondary | ICD-10-CM

## 2016-01-13 DIAGNOSIS — Y929 Unspecified place or not applicable: Secondary | ICD-10-CM | POA: Insufficient documentation

## 2016-01-13 DIAGNOSIS — S76111A Strain of right quadriceps muscle, fascia and tendon, initial encounter: Secondary | ICD-10-CM

## 2016-01-13 DIAGNOSIS — X58XXXA Exposure to other specified factors, initial encounter: Secondary | ICD-10-CM | POA: Insufficient documentation

## 2016-01-13 DIAGNOSIS — M25551 Pain in right hip: Secondary | ICD-10-CM

## 2016-01-13 DIAGNOSIS — M79609 Pain in unspecified limb: Secondary | ICD-10-CM | POA: Diagnosis not present

## 2016-01-13 DIAGNOSIS — Y999 Unspecified external cause status: Secondary | ICD-10-CM

## 2016-01-13 DIAGNOSIS — F1590 Other stimulant use, unspecified, uncomplicated: Secondary | ICD-10-CM

## 2016-01-13 MED ORDER — IBUPROFEN 200 MG PO TABS
400.0000 mg | ORAL_TABLET | Freq: Once | ORAL | Status: AC
  Administered 2016-01-13: 400 mg via ORAL
  Filled 2016-01-13: qty 2

## 2016-01-13 MED ORDER — ACETAMINOPHEN 500 MG PO TABS
1000.0000 mg | ORAL_TABLET | Freq: Once | ORAL | Status: AC
  Administered 2016-01-13: 1000 mg via ORAL
  Filled 2016-01-13: qty 2

## 2016-01-13 MED ORDER — METHOCARBAMOL 500 MG PO TABS: 1000.0000 mg | ORAL_TABLET | Freq: Once | ORAL | Status: DC

## 2016-01-13 NOTE — ED Triage Notes (Addendum)
Pt from home with c/o right hip pain that shoots down her leg occasionally. Pt states she gave birth 3 weeks ago. Pt states she was told to begin taking magnesium pills but never did. Pt states she is not able to walk without assistance. Symptoms began around 1000 on 11/29 Pt rates her pain at 10/10 and describes it as a throbbing pain Pt is breastfeeding.

## 2016-01-13 NOTE — MAU Provider Note (Signed)
History     Chief Complaint  Patient presents with  . Hip Pain   35 yo G7P3 BF s/p SVD 11/10 presents with right hip/leg pain since yesterday. Pt was in her usual state of health until she got up yesterday morning. Pt noted some discomfort in leg/hip and thought maybe related to how she had slept. Patient noted that hip became more progressively painful and was then unable to put pressure on her foot or lift leg to get into her car Pt took Motrin and was not helpful. [facility] and came here reportedly after she was told it was a long wait and that since she was postpartum she should come here  OB History    Gravida Para Term Preterm AB Living   7 3 3  0 4 3   SAB TAB Ectopic Multiple Live Births   0 3 1 0 3      Past Medical History:  Diagnosis Date  . Anemia   . Ectopic pregnancy Feb 2013  . Ectopic pregnancy, tubal 04/01/2011  . Hx of varicella   . Ovarian cyst   . Urinary tract infection   . Vitamin D deficiency     Past Surgical History:  Procedure Laterality Date  . DILATION AND CURETTAGE OF UTERUS    . INDUCED ABORTION     x 2  . LAPAROSCOPY  04/01/2011   Procedure: LAPAROSCOPY OPERATIVE;  Surgeon: Serita KyleSheronette A Felma Pfefferle, MD;  Location: [facility] ORS;  Service: Gynecology;  Laterality: Right;  Operative Laparoscopy with Right Salpingostomy    Family History  Problem Relation Age of Onset  . Cancer Maternal Grandmother   . Anesthesia problems Neg Hx   . Asthma Neg Hx   . Diabetes Neg Hx   . Heart disease Neg Hx   . Hypertension Neg Hx     Social History  Substance Use Topics  . Smoking status: Never Smoker  . Smokeless tobacco: Never Used  . Alcohol use No    Allergies:  Allergies  Allergen Reactions  . Sulfa Antibiotics Rash    Prescriptions Prior to Admission  Medication Sig Dispense Refill Last Dose  . ibuprofen (ADVIL,MOTRIN) 800 MG tablet Take 1 tablet (800 mg total) by mouth every 8 (eight) hours. 30 tablet 0 01/13/2016 at Unknown time  .  Iron Polysacch Cmplx-B12-FA 150-0.025-1 MG CAPS Take 1 capsule by mouth daily. 45 each 0   . magnesium oxide (MAG-OX) 400 (241.3 Mg) MG tablet Take 1 tablet (400 mg total) by mouth daily. 45 tablet 0   . Prenatal Vit-Fe Fumarate-FA (PRENATAL MULTIVITAMIN) TABS Take 1 tablet by mouth daily at 12 noon.   Past Week at Unknown time     Physical Exam   Blood pressure 142/82, pulse 90, temperature 98.2 F (36.8 C), resp. rate 18, last menstrual period 03/26/2015, unknown if currently breastfeeding.  General appearance: alert, cooperative and mild distress Extremities: right hip/thigh with limited mobility, palp tender, mild calf tenderness, no edema ED Course  IMP: Right hip/leg pain S/P SVD 11/10 P) dopplers of leg/thigh. If negative, [facility] MDM Addendum. Negative dopplers. [facility] . Spoke with attending. Pt will go there for further evaluation. Agree with plan. Pain med deferred as etiology of pain unknown. Husband agrees to drive pt over there  [place] Hotz A, MD 6:04 AM 01/13/2016

## 2016-01-13 NOTE — ED Provider Notes (Signed)
[facility]-EMERGENCY DEPT Provider Note   CSN: [id number] Arrival date & time: 01/13/16  0905     History   Chief Complaint Chief Complaint  Patient presents with  . Hip Pain    HPI [name] is a 35 y.o. female.  HPI  35 y.o. female presents to the Emergency Department today complaining of right hip pain since yesterday. Notes worse with ambulation and certain positioning. No trauma to area. [facility]/o and was negative today. Notes pain 8/10 and isolated on right side. Pain on outside of hip ad inside. No associated back pain. No saddle anesthesia. No loss of bowel or bladder function. No CP/SOB/ABD pain. No N/V/D/. No fevers. Taking ibuprofen with minimal relief. No other symptoms noted.    Past Medical History:  Diagnosis Date  . Anemia   . Ectopic pregnancy Feb 2013  . Ectopic pregnancy, tubal 04/01/2011  . Hx of varicella   . Ovarian cyst   . Urinary tract infection   . Vitamin D deficiency     Patient Active Problem List   Diagnosis Date Noted  . Postpartum state 12/25/2015  . Postpartum care following vaginal delivery (11/10) 12/25/2015  . Ectopic pregnancy, tubal 04/01/2011    Past Surgical History:  Procedure Laterality Date  . DILATION AND CURETTAGE OF UTERUS    . INDUCED ABORTION     x 2  . LAPAROSCOPY  04/01/2011   Procedure: LAPAROSCOPY OPERATIVE;  Surgeon: Serita KyleSheronette A [name], MD;  Location: [facility] ORS;  Service: Gynecology;  Laterality: Right;  Operative Laparoscopy with Right Salpingostomy    OB History    Gravida Para Term Preterm AB Living   7 3 3  0 4 3   SAB TAB Ectopic Multiple Live Births   0 3 1 0 3       Home Medications    Prior to Admission medications   Medication Sig Start Date End Date Taking? Authorizing Provider  ibuprofen (ADVIL,MOTRIN) 800 MG tablet Take 1 tablet (800 mg total) by mouth every 8 (eight) hours. 12/26/15   Marlinda Mikeanya Bailey, CNM  Iron Polysacch Cmplx-B12-FA 150-0.025-1 MG CAPS Take 1 capsule by  mouth daily. 12/26/15   Marlinda Mikeanya Bailey, CNM  magnesium oxide (MAG-OX) 400 (241.3 Mg) MG tablet Take 1 tablet (400 mg total) by mouth daily. 12/26/15   Marlinda Mikeanya Bailey, CNM  Prenatal Vit-Fe Fumarate-FA (PRENATAL MULTIVITAMIN) TABS Take 1 tablet by mouth daily at 12 noon.    Historical Provider, MD    Family History Family History  Problem Relation Age of Onset  . Cancer Maternal Grandmother   . Anesthesia problems Neg Hx   . Asthma Neg Hx   . Diabetes Neg Hx   . Heart disease Neg Hx   . Hypertension Neg Hx     Social History Social History  Substance Use Topics  . Smoking status: Never Smoker  . Smokeless tobacco: Never Used  . Alcohol use No     Allergies   Sulfa antibiotics   Review of Systems Review of Systems ROS reviewed and all are negative for acute change except as noted in the HPI.  Physical Exam Updated Vital Signs BP 130/92 (BP Location: Left Arm)   Pulse 94   Temp 98.2 F (36.8 C) (Oral)   Resp 18   LMP 03/26/2015   SpO2 100%   Physical Exam  Constitutional: She is oriented to person, place, and time. Vital signs are normal. She appears well-developed and well-nourished.  HENT:  Head:  Normocephalic.  Right Ear: Hearing normal.  Left Ear: Hearing normal.  Eyes: Conjunctivae and EOM are normal. Pupils are equal, round, and reactive to light.  Neck: Normal range of motion. Neck supple.  Cardiovascular: Normal rate, regular rhythm, normal heart sounds and intact distal pulses.   Pulmonary/Chest: Effort normal and breath sounds normal.  Musculoskeletal: Normal range of motion.  TTP right vastus medialis. No palpable or visible deformities noted. Internal/external rotation with pain. Able to ambulate with discomfort.    Neurological: She is alert and oriented to person, place, and time.  Skin: Skin is warm and dry.  Psychiatric: She has a normal mood and affect. Her speech is normal and behavior is normal. Thought content normal.  Nursing note and vitals  reviewed.  ED Treatments / Results  Labs (all labs ordered are listed, but only abnormal results are displayed) Labs Reviewed - No data to display  EKG  EKG Interpretation None       Radiology Dg Hip Unilat W Or Wo Pelvis 2-3 Views Right  Result Date: 01/13/2016 CLINICAL DATA:  Pain.  No known injury. EXAM: DG HIP (WITH OR WITHOUT PELVIS) 2-3V RIGHT COMPARISON:  No recent . FINDINGS: No acute bony abnormality identified. No evidence of fracture dislocation . Pelvic calcifications consistent with phleboliths. IMPRESSION: No acute abnormality. Electronically Signed   By: Maisie Fushomas  Register   On: 01/13/2016 10:40    Procedures Procedures (including critical care time)  Medications Ordered in ED Medications  methocarbamol (ROBAXIN) tablet 1,000 mg (not administered)     Initial Impression / Assessment and Plan / ED Course  I have reviewed the triage vital signs and the nursing notes.  Pertinent labs & imaging results that were available during my care of the patient were reviewed by me and considered in my medical decision making (see chart for details).  Clinical Course     Final Clinical Impressions(s) / ED Diagnoses   {I have reviewed and evaluated the relevant imaging studies.  {I have reviewed the relevant previous healthcare records.  {I obtained HPI from historian. {Patient discussed with supervising physician.  ED Course:  Assessment: Pt is a 35yF who presents with right hip pain since yesterday. Notes no trauma to area. [facility] and [facility]. Sent here for Ortho eval. On exam, pt in NAD. Nontoxic/nonseptic appearing. VSS. Afebrile. Lungs CTA. Heart RRR. Right hip TTP on lateral aspect. Pain with internal/external rotation. Xray imaging without acute abnormality. Given analgesia in ED. Seen by supervising physician. Pt is more tender along vastus medialis. Likely muscle strain. Given crutches and knee immobilizer with follow up to Ortho. Plan is  to DC Home. At time of discharge, Patient is in no acute distress. Vital Signs are stable. Patient is able to ambulate. Patient able to tolerate PO.   Disposition/Plan:  DC Home Additional Verbal discharge instructions given and discussed with patient.  Pt Instructed to f/u with Ortho in the next week for evaluation and treatment of symptoms. Return precautions given Pt acknowledges and agrees with plan  Supervising Physician Derwood KaplanAnkit Nanavati, MD  Final diagnoses:  Strain of right quadriceps muscle, initial encounter    New Prescriptions New Prescriptions   No medications on file     Audry Piliyler Chalice Philbert, PA-C 01/13/16 1302    Derwood KaplanAnkit Nanavati, MD 01/15/16 1247

## 2016-01-13 NOTE — MAU Note (Signed)
Pt reports hip pain where the pain has gotten progressively worse throughout the day. Pt rates pain 10/10. Took Motrin at 0000, with no relief.

## 2016-01-13 NOTE — ED Notes (Signed)
All pt's accounted for in lobby.  This pt not present.  Will d/c LWBS.

## 2016-01-13 NOTE — Progress Notes (Signed)
**  Preliminary report by tech**  Right lower extremity venous duplex complete. There is no evidence of deep or superficial vein thrombosis involving the right lower extremity. All visualized vessels appear patent and compressible. There is no evidence of a Baker's cyst on the right. Results were given to the patient's nurse, [facility].  01/13/16 8:42 AM Olen CordialGreg Zhamir Pirro RVT

## 2016-01-13 NOTE — ED Triage Notes (Signed)
Pt had child 3 weeks ago.  Pt c/o rt leg and hip pain. [facility]  Checked for blood clot.  Negative.  Told to come here to see orthopedic.

## 2016-01-13 NOTE — ED Notes (Signed)
MD at bedside. 

## 2016-01-13 NOTE — Discharge Instructions (Signed)
Please read and follow all provided instructions.  Your diagnoses today include:  1. Strain of right quadriceps muscle, initial encounter     Tests performed today include: Vital signs. See below for your results today.   Medications prescribed:  Take as prescribed   Home care instructions:  Follow any educational materials contained in this packet.  Follow-up instructions: Please follow-up with your orthopedics for further evaluation of symptoms and treatment   Return instructions:  Please return to the Emergency Department if you do not get better, if you get worse, or new symptoms OR  - Fever (temperature greater than 101.52F)  - Bleeding that does not stop with holding pressure to the area    -Severe pain (please note that you may be more sore the day after your accident)  - Chest Pain  - Difficulty breathing  - Severe nausea or vomiting  - Inability to tolerate food and liquids  - Passing out  - Skin becoming red around your wounds  - Change in mental status (confusion or lethargy)  - New numbness or weakness    Please return if you have any other emergent concerns.  Additional Information:  Your vital signs today were: BP 130/92 (BP Location: Left Arm)    Pulse 94    Temp 98.2 F (36.8 C) (Oral)    Resp 18    LMP 03/26/2015    SpO2 100%  If your blood pressure (BP) was elevated above 135/85 this visit, please have this repeated by your doctor within one month. ---------------

## 2019-12-31 NOTE — Progress Notes (Addendum)
Cardiology Office Note:   Date:  01/01/2020  NAME:  [name]    MRN: [id number] DOB:  1980-09-01   PCP:  Pam Better, MD  Cardiologist:  No primary care provider on file.   Referring MD: Pam Better, MD   Chief Complaint  Patient presents with  . Tachycardia    History of Present Illness:   [name] is a 39 y.o. female with a hx of anemia who is being seen today for the evaluation of tachycardia at the request of Pam Better, MD.  Seen by primary care physician recently for hypertension.  Apparently was noted to be tachycardic with a heart rate of 114.  EKG demonstrated normal sinus rhythm.  She was then referred to [place]. Reports has had a longstanding history of hypertension for roughly 3 years.  Her blood pressure today is 129/83.  She apparently was seen by her PCP and noted to have tachycardia.  She was started on propranolol and her heart rate has improved.  She does report that she can feel her heart racing at times.  This can occur daily.  Symptoms have been going on for 1 to 2 years.  Mainly occurs with exertion.  She does drink 1 cup of coffee per day.  She was drinking up to 2 L of Pepsi every 1 to 2 days but has cut back on this.  Symptoms have improved.  She does not exercise routinely.  Her diet is poor and consists of processed and salty foods.  She is trying to work on this.  Labs obtained by primary care physician office demonstrate hemoglobin 12.5.  She was noted to have a low MCV suggestive of iron deficiency.  TSH 0.99 with normal free T4 0.97.  Total cholesterol 208, triglycerides 60, HDL 48, LDL 149.  Her blood pressure is well controlled on hydrochlorothiazide 25 mg a day and propranolol 20 mg twice daily.  She denies any chest pain or shortness of breath.  Her EKG today demonstrates normal sinus rhythm with no acute ischemic changes or evidence of prior infarction.  She reports she had a stress test 14 years ago with the birth of her first son  but has not had cardiac evaluation since then.  She is a never smoker, does not use alcohol or drugs.  She is a mother of 3.  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  Past Medical History: Past Medical History:  Diagnosis Date  . Anemia   . Ectopic pregnancy Feb 2013  . Ectopic pregnancy, tubal 04/01/2011  . Hx of varicella   . Hypertension   . Ovarian cyst   . Urinary tract infection   . Vitamin D deficiency     Past Surgical History: Past Surgical History:  Procedure Laterality Date  . DILATION AND CURETTAGE OF UTERUS    . INDUCED ABORTION     x 2  . LAPAROSCOPY  04/01/2011   Procedure: LAPAROSCOPY OPERATIVE;  Surgeon: Serita Kyle, MD;  Location: [facility];  Service: Gynecology;  Laterality: Right;  Operative Laparoscopy with Right Salpingostomy    Current Medications: Current Meds  Medication Sig  . hydrochlorothiazide (HYDRODIURIL) 25 MG tablet Take 25 mg by mouth daily.  . propranolol (INDERAL) 20 MG tablet Take 20 mg by mouth 2 (two) times daily.  . SUMAtriptan (IMITREX) 25 MG tablet 25 mg as needed.     Allergies:    Sulfa antibiotics   Social History: Social History   Socioeconomic  History  . Marital status: Married    Spouse name: Not on file  . Number of children: 3  . Years of education: Not on file  . Highest education level: Not on file  Occupational History  . 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 Smoking status: Never Smoker  . Smokeless tobacco: Never Used  Substance and Sexual Activity  . Alcohol use: No  . Drug use: No  . Sexual activity: Yes    Birth control/protection: None  Other Topics Concern  . Not on file  Social History Narrative  . Not on file   Social Determinants of Health   Financial Resource Strain:   . Difficulty of Paying Living Expenses: Not on file  Food Insecurity:   . Worried About Programme researcher, broadcasting/film/video in the Last Year: Not on file  . Ran Out of Food in the Last Year:  Not on file  Transportation Needs:   . Lack of Transportation (Medical): Not on file  . Lack of Transportation (Non-Medical): Not on file  Physical Activity:   . Days of Exercise per Week: Not on file  . Minutes of Exercise per Session: Not on file  Stress:   . Feeling of Stress : Not on file  Social Connections:   . Frequency of Communication with Friends and Family: Not on file  . Frequency of Social Gatherings with Friends and Family: Not on file  . Attends Religious Services: Not on file  . Active Member of Clubs or Organizations: Not on file  . Attends Banker Meetings: Not on file  . Marital Status: Not on file     Family History: The patient's family history includes Cancer in her maternal grandmother. There is no history of Anesthesia problems, Asthma, Diabetes, Heart disease, or Hypertension.  ROS:   All other ROS reviewed and negative. Pertinent positives noted in the HPI.     EKGs/Labs/Other Studies Reviewed:   The following studies were personally reviewed by me today:  EKG:  EKG is ordered today.  The ekg ordered today demonstrates normal sinus rhythm, heart rate 91, no acute ischemic changes, no evidence of prior infarction, and was personally reviewed by me.   Recent Labs: No results found for requested labs within last 8760 hours.   Recent Lipid Panel No results found for: CHOL, TRIG, HDL, CHOLHDL, VLDL, LDLCALC, LDLDIRECT  Physical Exam:   VS:  BP 129/83 (BP Location: Left Arm, Patient Position: Sitting)   Pulse 91   Ht 5\' 5"  (1.651 m)   Wt 180 lb (81.6 kg)   BMI 29.95 kg/m    Wt Readings from Last 3 Encounters:  01/01/20 180 lb (81.6 kg)  12/24/15 206 lb (93.4 kg)  09/23/12 198 lb (89.8 kg)    General: Well nourished, well developed, in no acute distress Heart: Atraumatic, normal size  Eyes: PEERLA, EOMI  Neck: Supple, no JVD Endocrine: No thryomegaly Cardiac: Normal S1, S2; RRR; no murmurs, rubs, or gallops Lungs: Clear to  auscultation bilaterally, no wheezing, rhonchi or rales  Abd: Soft, nontender, no hepatomegaly  Ext: No edema, pulses 2+ Musculoskeletal: No deformities, BUE and BLE strength normal and equal Skin: Warm and dry, no rashes   Neuro: Alert and oriented to person, place, time, and situation, CNII-XII grossly intact, no focal deficits  Psych: Normal mood and affect   ASSESSMENT:   Jacqeline Broers Saban is a 39 y.o. female who presents for the following: 1. Tachycardia   2.  Palpitations   3. Primary hypertension     PLAN:   1. Tachycardia 2. Palpitations -She reports daily episodes of tachycardia and palpitations.  She was seen by her PCP and noted to have sinus tachycardia with a heart rate of 114.  She does drink a lot of caffeine but is cut down.  She was also started on propranolol and her heart rate is 91 today with a normal EKG.  Her blood pressure is much Long on propranolol and hydrochlorothiazide.  See below.  She describes no symptoms concerning for obstructive CAD or heart failure.  I recommended a 7-day Zio patch to exclude any arrhythmia given her symptoms of palpitations.  No significant stress reported.   -Labs from primary care physician demonstrate a TSH of 0.99 and a normal free T4 of 0.97.  She was noted to have a normal hemoglobin of 12.5.  Her MCV was 73 suggestive of iron deficiency.  This will need to be addressed by her primary care physician as this could explain her symptoms if she is iron deficient.  I will defer this to her primary care physician.  3. Primary hypertension -Blood pressure well controlled on HCTZ and propranolol.  We will continue these for now.   Disposition: Return in about 3 months (around 04/02/2020).  Medication Adjustments/Labs and Tests Ordered: Current medicines are reviewed at length with the patient today.  Concerns regarding medicines are outlined above.  Orders Placed This Encounter  Procedures  . LONG TERM MONITOR (3-14 DAYS)  . EKG 12-Lead    No orders of the defined types were placed in this encounter.   Patient Instructions  Medication Instructions:  Your Physician recommend you continue on your current medication as directed.    *If you need a refill on your cardiac medications before your next appointment, please call your pharmacy*   Lab Work: None   Testing/Procedures: Our physician has recommended that you wear an 7  DAY ZIO-PATCH monitor. The Zio patch cardiac monitor continuously records heart rhythm data for up to 14 days, this is for patients being evaluated for multiple types heart rhythms. For the first 24 hours post application, please avoid getting the Zio monitor wet in the shower or by excessive sweating during exercise. After that, feel free to carry on with regular activities. Keep soaps and lotions away from the ZIO XT Patch.   Someone from our office will call to verify address and mail monitor.     Follow-Up: At [facility], you and your health needs are our priority.  As part of our continuing mission to provide you with exceptional heart care, we have created designated Provider Care Teams.  These Care Teams include your primary Cardiologist (physician) and Advanced Practice Providers (APPs -  Physician Assistants and Nurse Practitioners) who all work together to provide you with the care you need, when you need it.  We recommend signing up for the patient portal called "MyChart".  Sign up information is provided on this After Visit Summary.  MyChart is used to connect with patients for Virtual Visits (Telemedicine).  Patients are able to view lab/test results, encounter notes, upcoming appointments, etc.  Non-urgent messages can be sent to your provider as well.   To learn more about what you can do with MyChart, go to [email or web address].    Your next appointment:   3 month(s)  The format for your next appointment:   In Person  Provider:   Lennie Odor, MD  ZIO XT- Long Term  Monitor Instructions   Your physician has requested you wear your ZIO patch monitor__7_____days.   This is a single patch monitor.  [employer] supplies one patch monitor per enrollment.  Additional stickers are not available.   Please do not apply patch if you will be having a Nuclear Stress Test, Echocardiogram, Cardiac CT, MRI, or Chest Xray during the time frame you would be wearing the monitor. The patch cannot be worn during these tests.  You cannot remove and re-apply the ZIO XT patch monitor.   Your ZIO patch monitor will be sent USPS Priority mail from [facility] directly to your home address. The monitor may also be mailed to a PO BOX if home delivery is not available.   It may take 3-5 days to receive your monitor after you have been enrolled.   Once you have received you monitor, please review enclosed instructions.  Your monitor has already been registered assigning a specific monitor serial # to you.   Applying the monitor   Shave hair from upper left chest.   Hold abrader disc by orange tab.  Rub abrader in 40 strokes over left upper chest as indicated in your monitor instructions.   Clean area with 4 enclosed alcohol pads .  Use all pads to assure are is cleaned thoroughly.  Let dry.   Apply patch as indicated in monitor instructions.  Patch will be place under collarbone on left side of chest with arrow pointing upward.   Rub patch adhesive wings for 2 minutes.Remove white label marked "1".  Remove white label marked "2".  Rub patch adhesive wings for 2 additional minutes.   While looking in a mirror, press and release button in center of patch.  A small green light will flash 3-4 times .  This will be your only indicator the monitor has been turned on.     Do not shower for the first 24 hours.  You may shower after the first 24 hours.   Press button if you feel a symptom. You will hear a small click.  Record Date, Time and Symptom in the Patient Log Book.   When  you are ready to remove patch, follow instructions on last 2 pages of Patient Log Book.  Stick patch monitor onto last page of Patient Log Book.   Place Patient Log Book in [place] box.  Use locking tab on box and tape box closed securely.  The Orange and [employer] box has [employer] postage on it.  Please place in mailbox as soon as possible.  Your physician should have your test results approximately 7 days after the monitor has been mailed back to [facility].   Call [facility] Customer Care at [phone] if you have questions regarding your ZIO XT patch monitor.  Call them immediately if you see an orange light blinking on your monitor.   If your monitor falls off in less than 4 days contact our Monitor department at [phone].  If your monitor becomes loose or falls off after 4 days call [employer] at [phone] for suggestions on securing your monitor.       Signed, Lenna GilfordWesley T. Flora Lipps'Neal, MD [facility]  [facility]  [address], [address] [place], [place] [address] [phone]  01/01/2020 9:52 AM

## 2020-01-01 ENCOUNTER — Encounter: Payer: Self-pay | Admitting: *Deleted

## 2020-01-01 ENCOUNTER — Ambulatory Visit (INDEPENDENT_AMBULATORY_CARE_PROVIDER_SITE_OTHER): Payer: 59 | Admitting: Cardiovascular Disease

## 2020-01-01 ENCOUNTER — Encounter: Payer: Self-pay | Admitting: Cardiovascular Disease

## 2020-01-01 ENCOUNTER — Other Ambulatory Visit: Payer: Self-pay

## 2020-01-01 VITALS — BP 129/83 | HR 91 | Ht 65.0 in | Wt 180.0 lb

## 2020-01-01 DIAGNOSIS — R000 Tachycardia, unspecified: Secondary | ICD-10-CM | POA: Diagnosis not present

## 2020-01-01 DIAGNOSIS — R002 Palpitations: Secondary | ICD-10-CM | POA: Diagnosis not present

## 2020-01-01 DIAGNOSIS — I10 Essential (primary) hypertension: Secondary | ICD-10-CM

## 2020-01-01 NOTE — Patient Instructions (Signed)
Medication Instructions:  Your Physician recommend you continue on your current medication as directed.    *If you need a refill on your cardiac medications before your next appointment, please call your pharmacy*   Lab Work: None   Testing/Procedures: Our physician has recommended that you wear an 7  DAY ZIO-PATCH monitor. The Zio patch cardiac monitor continuously records heart rhythm data for up to 14 days, this is for patients being evaluated for multiple types heart rhythms. For the first 24 hours post application, please avoid getting the Zio monitor wet in the shower or by excessive sweating during exercise. After that, feel free to carry on with regular activities. Keep soaps and lotions away from the ZIO XT Patch.   Someone from our office will call to verify address and mail monitor.     Follow-Up: At [facility], you and your health needs are our priority.  As part of our continuing mission to provide you with exceptional heart care, we have created designated Provider Care Teams.  These Care Teams include your primary Cardiologist (physician) and Advanced Practice Providers (APPs -  Physician Assistants and Nurse Practitioners) who all work together to provide you with the care you need, when you need it.  We recommend signing up for the patient portal called "MyChart".  Sign up information is provided on this After Visit Summary.  MyChart is used to connect with patients for Virtual Visits (Telemedicine).  Patients are able to view lab/test results, encounter notes, upcoming appointments, etc.  Non-urgent messages can be sent to your provider as well.   To learn more about what you can do with MyChart, go to [email or web address].    Your next appointment:   3 month(s)  The format for your next appointment:   In Person  Provider:   Lennie Odor, MD  Christena Deem- Long Term Monitor Instructions   Your physician has requested you wear your ZIO patch monitor__7_____days.    This is a single patch monitor.  Irhythm supplies one patch monitor per enrollment.  Additional stickers are not available.   Please do not apply patch if you will be having a Nuclear Stress Test, Echocardiogram, Cardiac CT, MRI, or Chest Xray during the time frame you would be wearing the monitor. The patch cannot be worn during these tests.  You cannot remove and re-apply the ZIO XT patch monitor.   Your ZIO patch monitor will be sent USPS Priority mail from [facility] directly to your home address. The monitor may also be mailed to a PO BOX if home delivery is not available.   It may take 3-5 days to receive your monitor after you have been enrolled.   Once you have received you monitor, please review enclosed instructions.  Your monitor has already been registered assigning a specific monitor serial # to you.   Applying the monitor   Shave hair from upper left chest.   Hold abrader disc by orange tab.  Rub abrader in 40 strokes over left upper chest as indicated in your monitor instructions.   Clean area with 4 enclosed alcohol pads .  Use all pads to assure are is cleaned thoroughly.  Let dry.   Apply patch as indicated in monitor instructions.  Patch will be place under collarbone on left side of chest with arrow pointing upward.   Rub patch adhesive wings for 2 minutes.Remove white label marked "1".  Remove white label marked "2".  Rub patch adhesive wings for 2 additional minutes.  While looking in a mirror, press and release button in center of patch.  A small green light will flash 3-4 times .  This will be your only indicator the monitor has been turned on.     Do not shower for the first 24 hours.  You may shower after the first 24 hours.   Press button if you feel a symptom. You will hear a small click.  Record Date, Time and Symptom in the Patient Log Book.   When you are ready to remove patch, follow instructions on last 2 pages of Patient Log Book.  Stick  patch monitor onto last page of Patient Log Book.   Place Patient Log Book in [place] box.  Use locking tab on box and tape box closed securely.  The Orange and [employer] has [employer] on it.  Please place in mailbox as soon as possible.  Your physician should have your test results approximately 7 days after the monitor has been mailed back to [facility].   Call [facility] Customer Care at [phone] if you have questions regarding your ZIO XT patch monitor.  Call them immediately if you see an orange light blinking on your monitor.   If your monitor falls off in less than 4 days contact our Monitor department at [phone].  If your monitor becomes loose or falls off after 4 days call [employer] at [phone] for suggestions on securing your monitor.

## 2020-01-01 NOTE — Progress Notes (Signed)
Patient ID: [name], female   DOB: 1980-07-10, 39 y.o.   MRN: [id number] Patient enrolled for [employer] to ship a 7 day ZIO XT long term holter monitor to her home.

## 2020-01-19 ENCOUNTER — Ambulatory Visit (INDEPENDENT_AMBULATORY_CARE_PROVIDER_SITE_OTHER): Payer: 59

## 2020-01-19 DIAGNOSIS — R000 Tachycardia, unspecified: Secondary | ICD-10-CM

## 2020-01-19 DIAGNOSIS — R002 Palpitations: Secondary | ICD-10-CM

## 2020-01-21 ENCOUNTER — Telehealth: Payer: Self-pay | Admitting: Cardiovascular Disease

## 2020-01-21 NOTE — Telephone Encounter (Signed)
Lowanda Foster is calling from the patient's referring office requesting the patient's consult notes be faxed over to them at [phone]. Please advise.

## 2020-01-27 NOTE — Telephone Encounter (Signed)
Last [facility] visit note faxed to Collie Siad, MD, office.

## 2020-04-04 NOTE — Progress Notes (Deleted)
Cardiology Office Note:   Date:  04/04/2020  NAME:  [name]    MRN: [id number] DOB:  1980-08-17   PCP:  Maxie Better, MD  Cardiologist:  No primary care provider on file.  Electrophysiologist:  None   Referring MD: Maxie Better, MD   No chief complaint on file. ***  History of Present Illness:   [name] is a 40 y.o. female with a hx of anemia who presents for follow-up of palpitations. Monitor showed brief atrial tachycardia (longest 2.7 seconds). Follow-up today. TSH 0.99. HGB 12.5.    Past Medical History: Past Medical History:  Diagnosis Date  . Anemia   . Ectopic pregnancy Feb 2013  . Ectopic pregnancy, tubal 04/01/2011  . Hx of varicella   . Hypertension   . Ovarian cyst   . Urinary tract infection   . Vitamin D deficiency     Past Surgical History: Past Surgical History:  Procedure Laterality Date  . DILATION AND CURETTAGE OF UTERUS    . INDUCED ABORTION     x 2  . LAPAROSCOPY  04/01/2011   Procedure: LAPAROSCOPY OPERATIVE;  Surgeon: Serita Kyle, MD;  Location: [facility];  Service: Gynecology;  Laterality: Right;  Operative Laparoscopy with Right Salpingostomy    Current Medications: No outpatient medications have been marked as taking for the 04/07/20 encounter (Appointment) with Sande Rives, MD.     Allergies:    Sulfa antibiotics   Social History: Social History   Socioeconomic History  . Marital status: Married    Spouse name: Not on file  . Number of children: 3  . Years of education: Not on file  . Highest education level: Not on file  Occupational History  . 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 Smoking status: Never Smoker  . Smokeless tobacco: Never Used  Substance and Sexual Activity  . Alcohol use: No  . Drug use: No  . Sexual activity: Yes    Birth control/protection: None  Other Topics Concern  . Not on file  Social History Narrative  . Not on file   Social  Determinants of Health   Financial Resource Strain: Not on file  Food Insecurity: Not on file  Transportation Needs: Not on file  Physical Activity: Not on file  Stress: Not on file  Social Connections: Not on file     Family History: The patient's ***family history includes Cancer in her maternal grandmother. There is no history of Anesthesia problems, Asthma, Diabetes, Heart disease, or Hypertension.  ROS:   All other ROS reviewed and negative. Pertinent positives noted in the HPI.     EKGs/Labs/Other Studies Reviewed:   The following studies were personally reviewed by me today:  EKG:  EKG is *** ordered today.  The ekg ordered today demonstrates ***, and was personally reviewed by me.   Zio 02/09/2020 1. Brief atrial tachycardia episodes (2 in 7 days; longest duration 2.8 seconds). 2. Rare ectopy.    Recent Labs: No results found for requested labs within last 8760 hours.   Recent Lipid Panel No results found for: CHOL, TRIG, HDL, CHOLHDL, VLDL, LDLCALC, LDLDIRECT  Physical Exam:   VS:  There were no vitals taken for this visit.   Wt Readings from Last 3 Encounters:  01/01/20 180 lb (81.6 kg)  12/24/15 206 lb (93.4 kg)  09/23/12 198 lb (89.8 kg)    General: Well nourished, well developed, in no acute distress Head: Atraumatic, normal size  Eyes: PEERLA, EOMI  Neck: Supple, no JVD Endocrine: No thryomegaly Cardiac: Normal S1, S2; RRR; no murmurs, rubs, or gallops Lungs: Clear to auscultation bilaterally, no wheezing, rhonchi or rales  Abd: Soft, nontender, no hepatomegaly  Ext: No edema, pulses 2+ Musculoskeletal: No deformities, BUE and BLE strength normal and equal Skin: Warm and dry, no rashes   Neuro: Alert and oriented to person, place, time, and situation, CNII-XII grossly intact, no focal deficits  Psych: Normal mood and affect   ASSESSMENT:   [name] is a 40 y.o. female who presents for the following: No diagnosis found.  PLAN:   There are  no diagnoses linked to this encounter.  Disposition: No follow-ups on file.  Medication Adjustments/Labs and Tests Ordered: Current medicines are reviewed at length with the patient today.  Concerns regarding medicines are outlined above.  No orders of the defined types were placed in this encounter.  No orders of the defined types were placed in this encounter.   There are no Patient Instructions on file for this visit.   Time Spent with Patient: I have spent a total of *** minutes with patient reviewing hospital notes, telemetry, EKGs, labs and examining the patient as well as establishing an assessment and plan that was discussed with the patient.  > 50% of time was spent in direct patient care.  Signed, Lenna Gilford. Flora Lipps, MD [facility]  [address], [address] [place], [place] [address] [phone]  04/04/2020 7:14 PM

## 2020-04-07 ENCOUNTER — Ambulatory Visit: Payer: 59 | Admitting: Cardiovascular Disease

## 2020-04-07 DIAGNOSIS — I471 Supraventricular tachycardia: Secondary | ICD-10-CM

## 2020-04-07 DIAGNOSIS — R002 Palpitations: Secondary | ICD-10-CM

## 2020-04-07 DIAGNOSIS — I10 Essential (primary) hypertension: Secondary | ICD-10-CM

## 2020-04-07 DIAGNOSIS — R000 Tachycardia, unspecified: Secondary | ICD-10-CM

## 2020-04-25 NOTE — Progress Notes (Deleted)
Cardiology Office Note:   Date:  04/25/2020  NAME:  [name]    MRN: [id number] DOB:  [birth year]   PCP:  Maxie Better, MD  Cardiologist:  No primary care provider on file.  Electrophysiologist:  None   Referring MD: Maxie Better, MD   No chief complaint on file. ***  History of Present Illness:   [name] is a 40 y.o. female with a hx of HTN who presents for tachycardia. Completed monitor which showed average HR 92 bpm. No arrhythmias.   Problem List 1. HTN  Past Medical History: Past Medical History:  Diagnosis Date  . Anemia   . Ectopic pregnancy Feb 2013  . Ectopic pregnancy, tubal 04/01/2011  . Hx of varicella   . Hypertension   . Ovarian cyst   . Urinary tract infection   . Vitamin D deficiency     Past Surgical History: Past Surgical History:  Procedure Laterality Date  . DILATION AND CURETTAGE OF UTERUS    . INDUCED ABORTION     x 2  . LAPAROSCOPY  04/01/2011   Procedure: LAPAROSCOPY OPERATIVE;  Surgeon: Serita Kyle, MD;  Location: [facility];  Service: Gynecology;  Laterality: Right;  Operative Laparoscopy with Right Salpingostomy    Current Medications: No outpatient medications have been marked as taking for the 04/26/20 encounter (Appointment) with O'Neal, Ronnald Ramp, MD.     Allergies:    Sulfa antibiotics   Social History: Social History   Socioeconomic History  . Marital status: Married    Spouse name: Not on file  . Number of children: 3  . Years of education: Not on file  . Highest education level: Not on file  Occupational History  . 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 Smoking status: Never Smoker  . Smokeless tobacco: Never Used  Substance and Sexual Activity  . Alcohol use: No  . Drug use: No  . Sexual activity: Yes    Birth control/protection: None  Other Topics Concern  . Not on file  Social History Narrative  . Not on file   Social Determinants of Health    Financial Resource Strain: Not on file  Food Insecurity: Not on file  Transportation Needs: Not on file  Physical Activity: Not on file  Stress: Not on file  Social Connections: Not on file     Family History: The patient's ***family history includes Cancer in her maternal grandmother. There is no history of Anesthesia problems, Asthma, Diabetes, Heart disease, or Hypertension.  ROS:   All other ROS reviewed and negative. Pertinent positives noted in the HPI.     EKGs/Labs/Other Studies Reviewed:   The following studies were personally reviewed by me today:  EKG:  EKG is *** ordered today.  The ekg ordered today demonstrates ***, and was personally reviewed by me.   Recent Labs: No results found for requested labs within last 8760 hours.   Recent Lipid Panel No results found for: CHOL, TRIG, HDL, CHOLHDL, VLDL, LDLCALC, LDLDIRECT  Physical Exam:   VS:  There were no vitals taken for this visit.   Wt Readings from Last 3 Encounters:  01/01/20 180 lb (81.6 kg)  12/24/15 206 lb (93.4 kg)  09/23/12 198 lb (89.8 kg)    General: Well nourished, well developed, in no acute distress Head: Atraumatic, normal size  Eyes: PEERLA, EOMI  Neck: Supple, no JVD Endocrine: No thryomegaly Cardiac: Normal S1, S2; RRR; no murmurs, rubs, or gallops Lungs: Clear  to auscultation bilaterally, no wheezing, rhonchi or rales  Abd: Soft, nontender, no hepatomegaly  Ext: No edema, pulses 2+ Musculoskeletal: No deformities, BUE and BLE strength normal and equal Skin: Warm and dry, no rashes   Neuro: Alert and oriented to person, place, time, and situation, CNII-XII grossly intact, no focal deficits  Psych: Normal mood and affect   ASSESSMENT:   [name] is a 40 y.o. female who presents for the following: No diagnosis found.  PLAN:   There are no diagnoses linked to this encounter.  Disposition: No follow-ups on file.  Medication Adjustments/Labs and Tests Ordered: Current  medicines are reviewed at length with the patient today.  Concerns regarding medicines are outlined above.  No orders of the defined types were placed in this encounter.  No orders of the defined types were placed in this encounter.   There are no Patient Instructions on file for this visit.   Time Spent with Patient: I have spent a total of *** minutes with patient reviewing hospital notes, telemetry, EKGs, labs and examining the patient as well as establishing an assessment and plan that was discussed with the patient.  > 50% of time was spent in direct patient care.  Signed, Lenna Gilford. Flora Lipps, MD, [facility]  [facility]  [address], [address] [place], [place] [address] [phone]  04/25/2020 7:51 PM

## 2020-04-26 ENCOUNTER — Ambulatory Visit: Payer: 59 | Admitting: Cardiovascular Disease

## 2020-04-26 DIAGNOSIS — R002 Palpitations: Secondary | ICD-10-CM

## 2020-04-26 DIAGNOSIS — I10 Essential (primary) hypertension: Secondary | ICD-10-CM

## 2020-04-26 DIAGNOSIS — R000 Tachycardia, unspecified: Secondary | ICD-10-CM

## 2020-05-02 NOTE — Progress Notes (Deleted)
Cardiology Office Note:   Date:  05/02/2020  NAME:  DEANIE JUPITER    MRN: [id number] DOB:  1980/11/24   PCP:  Maxie Better, MD  Cardiologist:  No primary care provider on file.  Electrophysiologist:  None   Referring MD: Maxie Better, MD   No chief complaint on file. ***  History of Present Illness:   Jaydah Stahle Short is a 40 y.o. female with a hx of anemia who presents for follow-up of palpitations. Labs notable for normal thyroid studies. Normal HGB but iron a bit low. Her monitor showed brief SVT that lasted <3 seconds.   Past Medical History: Past Medical History:  Diagnosis Date  . Anemia   . Ectopic pregnancy Feb 2013  . Ectopic pregnancy, tubal 04/01/2011  . Hx of varicella   . Hypertension   . Ovarian cyst   . Urinary tract infection   . Vitamin D deficiency     Past Surgical History: Past Surgical History:  Procedure Laterality Date  . DILATION AND CURETTAGE OF UTERUS    . INDUCED ABORTION     x 2  . LAPAROSCOPY  04/01/2011   Procedure: LAPAROSCOPY OPERATIVE;  Surgeon: Serita Kyle, MD;  Location: [facility];  Service: Gynecology;  Laterality: Right;  Operative Laparoscopy with Right Salpingostomy    Current Medications: No outpatient medications have been marked as taking for the 05/03/20 encounter (Appointment) with O'Neal, Ronnald Ramp, MD.     Allergies:    Sulfa antibiotics   Social History: Social History   Socioeconomic History  . Marital status: Married    Spouse name: Not on file  . Number of children: 3  . Years of education: Not on file  . Highest education level: Not on file  Occupational History  . 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 Smoking status: Never Smoker  . Smokeless tobacco: Never Used  Substance and Sexual Activity  . Alcohol use: No  . Drug use: No  . Sexual activity: Yes    Birth control/protection: None  Other Topics Concern  . Not on file  Social History Narrative  . Not  on file   Social Determinants of Health   Financial Resource Strain: Not on file  Food Insecurity: Not on file  Transportation Needs: Not on file  Physical Activity: Not on file  Stress: Not on file  Social Connections: Not on file     Family History: The patient's ***family history includes Cancer in her maternal grandmother. There is no history of Anesthesia problems, Asthma, Diabetes, Heart disease, or Hypertension.  ROS:   All other ROS reviewed and negative. Pertinent positives noted in the HPI.     EKGs/Labs/Other Studies Reviewed:   The following studies were personally reviewed by me today:  EKG:  EKG is *** ordered today.  The ekg ordered today demonstrates ***, and was personally reviewed by me.   zio 02/09/2020  1. Brief atrial tachycardia episodes (2 in 7 days; longest duration 2.8 seconds). 2. Rare ectopy.    Recent Labs: No results found for requested labs within last 8760 hours.   Recent Lipid Panel No results found for: CHOL, TRIG, HDL, CHOLHDL, VLDL, LDLCALC, LDLDIRECT  Physical Exam:   VS:  There were no vitals taken for this visit.   Wt Readings from Last 3 Encounters:  01/01/20 180 lb (81.6 kg)  12/24/15 206 lb (93.4 kg)  09/23/12 198 lb (89.8 kg)    General: Well nourished, well developed, in  no acute distress Head: Atraumatic, normal size  Eyes: PEERLA, EOMI  Neck: Supple, no JVD Endocrine: No thryomegaly Cardiac: Normal S1, S2; RRR; no murmurs, rubs, or gallops Lungs: Clear to auscultation bilaterally, no wheezing, rhonchi or rales  Abd: Soft, nontender, no hepatomegaly  Ext: No edema, pulses 2+ Musculoskeletal: No deformities, BUE and BLE strength normal and equal Skin: Warm and dry, no rashes   Neuro: Alert and oriented to person, place, time, and situation, CNII-XII grossly intact, no focal deficits  Psych: Normal mood and affect   ASSESSMENT:   Shirley Decamp Brodbeck is a 40 y.o. female who presents for the following: No diagnosis  found.  PLAN:   There are no diagnoses linked to this encounter.  Disposition: No follow-ups on file.  Medication Adjustments/Labs and Tests Ordered: Current medicines are reviewed at length with the patient today.  Concerns regarding medicines are outlined above.  No orders of the defined types were placed in this encounter.  No orders of the defined types were placed in this encounter.   There are no Patient Instructions on file for this visit.   Time Spent with Patient: I have spent a total of *** minutes with patient reviewing hospital notes, telemetry, EKGs, labs and examining the patient as well as establishing an assessment and plan that was discussed with the patient.  > 50% of time was spent in direct patient care.  Signed, Lenna Gilford. Flora Lipps, MD, [facility]  [facility]  [address] [place], [place] [address] [phone]  05/02/2020 2:07 PM

## 2020-05-03 ENCOUNTER — Ambulatory Visit: Payer: 59 | Admitting: Cardiovascular Disease

## 2020-05-03 DIAGNOSIS — R002 Palpitations: Secondary | ICD-10-CM

## 2020-05-03 DIAGNOSIS — I471 Supraventricular tachycardia: Secondary | ICD-10-CM

## 2020-05-06 NOTE — Progress Notes (Unsigned)
Cardiology Office Note:   Date:  05/07/2020  NAME:  [name]    MRN: [id number] DOB:  January 18, 1981   PCP:  Maxie Better, MD  Cardiologist:  No primary care provider on file.   Referring MD: Maxie Better, MD   Chief Complaint  Patient presents with  . Follow-up    History of Present Illness:   [name] is a 40 y.o. female with a hx of anemia who presents for follow-up. She did have iron deficiency anemia. HGB 12.5 with low MCV. Monitor with brief SVT <3 seconds without symptoms.  She reports she may have fluttering in her chest periodically.  Symptoms can occur up to 1 time per week.  They last seconds.  She also describes a dull achiness in her chest.  This can occur at any time.  Can occur with lifting or doing activity.  Last seconds.  She may experience 1-2 episodes of this per month.  No shortness of breath reported.  She reports she is still stressed out.  She is working on stress reduction strategies.  She is exercising walking up to 30 minutes/day.  No chest pain or shortness of breath reported with this.  Blood pressure well controlled today 134/84.  We discussed that she was mildly anemic.  She will continue to follow with her primary care physician about this.  She is still taking propranolol which is okay.  Blood pressure well controlled.  No worrisome pathology found.  Past Medical History: Past Medical History:  Diagnosis Date  . Anemia   . Ectopic pregnancy Feb 2013  . Ectopic pregnancy, tubal 04/01/2011  . Hx of varicella   . Hypertension   . Ovarian cyst   . Urinary tract infection   . Vitamin D deficiency     Past Surgical History: Past Surgical History:  Procedure Laterality Date  . DILATION AND CURETTAGE OF UTERUS    . INDUCED ABORTION     x 2  . LAPAROSCOPY  04/01/2011   Procedure: LAPAROSCOPY OPERATIVE;  Surgeon: Serita Kyle, MD;  Location: [facility];  Service: Gynecology;  Laterality: Right;  Operative Laparoscopy with Right  Salpingostomy    Current Medications: Current Meds  Medication Sig  . hydrochlorothiazide (HYDRODIURIL) 25 MG tablet Take 25 mg by mouth daily.  . propranolol (INDERAL) 20 MG tablet Take 20 mg by mouth 2 (two) times daily.  . SUMAtriptan (IMITREX) 25 MG tablet 25 mg as needed.     Allergies:    Sulfa antibiotics   Social History: Social History   Socioeconomic History  . Marital status: Married    Spouse name: Not on file  . Number of children: 3  . Years of education: Not on file  . Highest education level: Not on file  Occupational History  . 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 Smoking status: Never Smoker  . Smokeless tobacco: Never Used  Substance and Sexual Activity  . Alcohol use: No  . Drug use: No  . Sexual activity: Yes    Birth control/protection: None  Other Topics Concern  . Not on file  Social History Narrative  . Not on file   Social Determinants of Health   Financial Resource Strain: Not on file  Food Insecurity: Not on file  Transportation Needs: Not on file  Physical Activity: Not on file  Stress: Not on file  Social Connections: Not on file     Family History: The patient's family history includes Cancer  in her maternal grandmother. There is no history of Anesthesia problems, Asthma, Diabetes, Heart disease, or Hypertension.  ROS:   All other ROS reviewed and negative. Pertinent positives noted in the HPI.     EKGs/Labs/Other Studies Reviewed:   The following studies were personally reviewed by me today:  Zio 02/09/2020  1. Brief atrial tachycardia episodes (2 in 7 days; longest duration 2.8 seconds). 2. Rare ectopy.    Recent Labs: No results found for requested labs within last 8760 hours.   Recent Lipid Panel No results found for: CHOL, TRIG, HDL, CHOLHDL, VLDL, LDLCALC, LDLDIRECT  Physical Exam:   VS:  BP 134/84   Pulse 99   Ht 5\' 5"  (1.651 m)   Wt 179 lb 9.6 oz (81.5 kg)   SpO2 97%   BMI  29.89 kg/m    Wt Readings from Last 3 Encounters:  05/07/20 179 lb 9.6 oz (81.5 kg)  01/01/20 180 lb (81.6 kg)  12/24/15 206 lb (93.4 kg)    General: Well nourished, well developed, in no acute distress Head: Atraumatic, normal size  Eyes: PEERLA, EOMI  Neck: Supple, no JVD Endocrine: No thryomegaly Cardiac: Normal S1, S2; RRR; no murmurs, rubs, or gallops Lungs: Clear to auscultation bilaterally, no wheezing, rhonchi or rales  Abd: Soft, nontender, no hepatomegaly  Ext: No edema, pulses 2+ Musculoskeletal: No deformities, BUE and BLE strength normal and equal Skin: Warm and dry, no rashes   Neuro: Alert and oriented to person, place, time, and situation, CNII-XII grossly intact, no focal deficits  Psych: Normal mood and affect   ASSESSMENT:   [name] is a 40 y.o. female who presents for the following: 1. Palpitations   2. Chest pain of uncertain etiology     PLAN:   1. Palpitations -She was experiencing palpitations.  Monitor with brief PACs.  No real worrisome pathology.  Cardiovascular examination is normal.  EKG is normal.  I suspect her symptoms are due to stress related.  It is okay to continue propranolol for now.  She can be weaned off if needed.  There is no strong indication to treat anything found on her heart monitor.  She will work on hydration, exercise, good sleep and stress reductive strategies.  2. Chest pain of uncertain etiology -Atypical chest pain.  EKG at her last visit was normal.  Suspect her symptoms are due to stress related.  Her cardiovascular examination is normal.  She is too young for any coronary artery disease.  I would recommend the above measures including good sleep, hydration, regular exercise and stress reduction.   Disposition: No follow-ups on file.  Medication Adjustments/Labs and Tests Ordered: Current medicines are reviewed at length with the patient today.  Concerns regarding medicines are outlined above.  No orders of the  defined types were placed in this encounter.  No orders of the defined types were placed in this encounter.   Patient Instructions  Medication Instructions:  The current medical regimen is effective;  continue present plan and medications.  *If you need a refill on your cardiac medications before your next appointment, please call your pharmacy*   Follow-Up: At [facility], you and your health needs are our priority.  As part of our continuing mission to provide you with exceptional heart care, we have created designated Provider Care Teams.  These Care Teams include your primary Cardiologist (physician) and Advanced Practice Providers (APPs -  Physician Assistants and Nurse Practitioners) who all work together to provide you with  the care you need, when you need it.  We recommend signing up for the patient portal called "MyChart".  Sign up information is provided on this After Visit Summary.  MyChart is used to connect with patients for Virtual Visits (Telemedicine).  Patients are able to view lab/test results, encounter notes, upcoming appointments, etc.  Non-urgent messages can be sent to your provider as well.   To learn more about what you can do with MyChart, go to [email or web address].    Your next appointment:   As needed  The format for your next appointment:   In Person  Provider:   Lennie Odor, MD       Time Spent with Patient: I have spent a total of 25 minutes with patient reviewing hospital notes, telemetry, EKGs, labs and examining the patient as well as establishing an assessment and plan that was discussed with the patient.  > 50% of time was spent in direct patient care.  Signed, Lenna Gilford. Flora Lipps, MD, [facility]  [facility]  [address], [address] [place], [place] [address] [phone]  05/07/2020 9:43 AM

## 2020-05-07 ENCOUNTER — Ambulatory Visit (INDEPENDENT_AMBULATORY_CARE_PROVIDER_SITE_OTHER): Payer: 59 | Admitting: Cardiovascular Disease

## 2020-05-07 ENCOUNTER — Other Ambulatory Visit: Payer: Self-pay

## 2020-05-07 ENCOUNTER — Encounter: Payer: Self-pay | Admitting: Cardiovascular Disease

## 2020-05-07 VITALS — BP 134/84 | HR 99 | Ht 65.0 in | Wt 179.6 lb

## 2020-05-07 DIAGNOSIS — R002 Palpitations: Secondary | ICD-10-CM | POA: Diagnosis not present

## 2020-05-07 DIAGNOSIS — R079 Chest pain, unspecified: Secondary | ICD-10-CM | POA: Diagnosis not present

## 2020-05-07 NOTE — Patient Instructions (Signed)
Medication Instructions:  The current medical regimen is effective;  continue present plan and medications.  *If you need a refill on your cardiac medications before your next appointment, please call your pharmacy*    Follow-Up: At [facility], you and your health needs are our priority.  As part of our continuing mission to provide you with exceptional heart care, we have created designated Provider Care Teams.  These Care Teams include your primary Cardiologist (physician) and Advanced Practice Providers (APPs -  Physician Assistants and Nurse Practitioners) who all work together to provide you with the care you need, when you need it.  We recommend signing up for the patient portal called "MyChart".  Sign up information is provided on this After Visit Summary.  MyChart is used to connect with patients for Virtual Visits (Telemedicine).  Patients are able to view lab/test results, encounter notes, upcoming appointments, etc.  Non-urgent messages can be sent to your provider as well.   To learn more about what you can do with MyChart, go to [email or web address].    Your next appointment:   As needed  The format for your next appointment:   In Person  Provider:   [place] O'Neal, MD      

## 2021-08-31 ENCOUNTER — Other Ambulatory Visit: Payer: Self-pay
